# Patient Record
Sex: Male | Born: 1935 | Race: White | Hispanic: No | Marital: Married | Smoking: Former smoker
Health system: Southern US, Community
[De-identification: ages and names within clinical notes are randomized; demographics above are authoritative.]

## PROBLEM LIST (undated history)

## (undated) DIAGNOSIS — R32 Unspecified urinary incontinence: Secondary | ICD-10-CM

## (undated) DIAGNOSIS — T4145XA Adverse effect of unspecified anesthetic, initial encounter: Secondary | ICD-10-CM

## (undated) DIAGNOSIS — R112 Nausea with vomiting, unspecified: Secondary | ICD-10-CM

## (undated) DIAGNOSIS — R011 Cardiac murmur, unspecified: Secondary | ICD-10-CM

## (undated) DIAGNOSIS — Z9889 Other specified postprocedural states: Secondary | ICD-10-CM

## (undated) DIAGNOSIS — I10 Essential (primary) hypertension: Secondary | ICD-10-CM

## (undated) DIAGNOSIS — T8859XA Other complications of anesthesia, initial encounter: Secondary | ICD-10-CM

## (undated) DIAGNOSIS — K8020 Calculus of gallbladder without cholecystitis without obstruction: Secondary | ICD-10-CM

## (undated) DIAGNOSIS — R51 Headache: Secondary | ICD-10-CM

## (undated) DIAGNOSIS — K219 Gastro-esophageal reflux disease without esophagitis: Secondary | ICD-10-CM

## (undated) DIAGNOSIS — C801 Malignant (primary) neoplasm, unspecified: Secondary | ICD-10-CM

## (undated) HISTORY — PX: IMPLANT FLETCHER SUIT / INTRACAVITARY RADIATION APPLICATION: SUR685

## (undated) HISTORY — PX: TONSILLECTOMY: SUR1361

## (undated) HISTORY — PX: OTHER SURGICAL HISTORY: SHX169

## (undated) HISTORY — PX: HERNIA REPAIR: SHX51

---

## 1998-04-13 ENCOUNTER — Ambulatory Visit (HOSPITAL_COMMUNITY): Admission: RE | Admit: 1998-04-13 | Discharge: 1998-04-13 | Payer: Self-pay | Admitting: Urology

## 1999-07-06 ENCOUNTER — Encounter: Admission: RE | Admit: 1999-07-06 | Discharge: 1999-07-06 | Payer: Self-pay | Admitting: *Deleted

## 1999-07-06 ENCOUNTER — Encounter: Payer: Self-pay | Admitting: *Deleted

## 1999-07-13 ENCOUNTER — Encounter: Admission: RE | Admit: 1999-07-13 | Discharge: 1999-07-13 | Payer: Self-pay | Admitting: *Deleted

## 1999-07-13 ENCOUNTER — Encounter: Payer: Self-pay | Admitting: *Deleted

## 1999-08-23 ENCOUNTER — Other Ambulatory Visit: Admission: RE | Admit: 1999-08-23 | Discharge: 1999-08-23 | Payer: Self-pay | Admitting: Orthopedic Surgery

## 2002-09-17 HISTORY — PX: NEPHRECTOMY: SHX65

## 2003-02-19 ENCOUNTER — Encounter: Admission: RE | Admit: 2003-02-19 | Discharge: 2003-02-19 | Payer: Self-pay | Admitting: Internal Medicine

## 2003-02-19 ENCOUNTER — Encounter: Payer: Self-pay | Admitting: Internal Medicine

## 2003-03-08 ENCOUNTER — Encounter: Payer: Self-pay | Admitting: Urology

## 2003-03-10 ENCOUNTER — Encounter (INDEPENDENT_AMBULATORY_CARE_PROVIDER_SITE_OTHER): Payer: Self-pay | Admitting: *Deleted

## 2003-03-10 ENCOUNTER — Inpatient Hospital Stay (HOSPITAL_COMMUNITY): Admission: RE | Admit: 2003-03-10 | Discharge: 2003-03-14 | Payer: Self-pay | Admitting: Urology

## 2003-07-19 ENCOUNTER — Ambulatory Visit (HOSPITAL_COMMUNITY): Admission: RE | Admit: 2003-07-19 | Discharge: 2003-07-19 | Payer: Self-pay | Admitting: Urology

## 2004-08-01 ENCOUNTER — Ambulatory Visit (HOSPITAL_COMMUNITY): Admission: RE | Admit: 2004-08-01 | Discharge: 2004-08-01 | Payer: Self-pay | Admitting: Urology

## 2005-07-26 ENCOUNTER — Ambulatory Visit (HOSPITAL_COMMUNITY): Admission: RE | Admit: 2005-07-26 | Discharge: 2005-07-26 | Payer: Self-pay | Admitting: Urology

## 2005-09-17 HISTORY — PX: OTHER SURGICAL HISTORY: SHX169

## 2006-06-14 ENCOUNTER — Ambulatory Visit (HOSPITAL_COMMUNITY): Admission: RE | Admit: 2006-06-14 | Discharge: 2006-06-14 | Payer: Self-pay | Admitting: Urology

## 2006-07-10 ENCOUNTER — Encounter (INDEPENDENT_AMBULATORY_CARE_PROVIDER_SITE_OTHER): Payer: Self-pay | Admitting: Specialist

## 2006-07-10 ENCOUNTER — Inpatient Hospital Stay (HOSPITAL_COMMUNITY): Admission: RE | Admit: 2006-07-10 | Discharge: 2006-07-12 | Payer: Self-pay | Admitting: Urology

## 2006-10-15 ENCOUNTER — Ambulatory Visit: Admission: RE | Admit: 2006-10-15 | Discharge: 2006-12-24 | Payer: Self-pay | Admitting: Radiation Oncology

## 2007-05-06 ENCOUNTER — Ambulatory Visit (HOSPITAL_COMMUNITY): Admission: RE | Admit: 2007-05-06 | Discharge: 2007-05-06 | Payer: Self-pay | Admitting: Urology

## 2007-05-16 ENCOUNTER — Encounter (HOSPITAL_COMMUNITY): Admission: RE | Admit: 2007-05-16 | Discharge: 2007-06-11 | Payer: Self-pay | Admitting: Urology

## 2007-09-25 ENCOUNTER — Ambulatory Visit (HOSPITAL_COMMUNITY): Admission: RE | Admit: 2007-09-25 | Discharge: 2007-09-25 | Payer: Self-pay | Admitting: Urology

## 2010-03-03 ENCOUNTER — Ambulatory Visit (HOSPITAL_COMMUNITY): Admission: RE | Admit: 2010-03-03 | Discharge: 2010-03-03 | Payer: Self-pay | Admitting: Urology

## 2011-02-02 NOTE — Discharge Summary (Signed)
NAME:  [name], [name]                        ACCOUNT NO.:  [id number]   MEDICAL RECORD NO.:  [id number]                   PATIENT TYPE:  INP   LOCATION:  0373                                 FACILITY:  [facility]   PHYSICIAN:  Valetta Fuller, M.D.               DATE OF BIRTH:  24-Mar-1933   DATE OF ADMISSION:  03/10/2003  DATE OF DISCHARGE:  03/14/2003                                 DISCHARGE SUMMARY   DISCHARGE DIAGNOSES:  1. Malignant neoplasm of the kidney.  2. Acute renal failure.   PROCEDURE:  Hand-assisted laparoscopic right radical nephrectomy on March 10, 2003.   HISTORY OF PRESENT ILLNESS:  Mr. Nishi is a 77 year old male who is  primarily a patient of Dr. Darvin Neighbours'. The patient has been seen by Dr.  Earlene Plater primarily for a history of elevated PSA, BPH and some voiding  symptoms. The patient had recently developed intractable hiccups and as part  of his evaluation, had an abdominal ultrasound which showed a large right-  sided renal mass. A CT was performed which confirmed a 8-9 cm mass in the  lower pole of the right kidney, felt to be a renal cell carcinoma. We did  not feel that this was the cause for his hiccups but was an incidental  finding. Metastatic workup was negative and his contralateral kidney  appeared normal with the exception of an atypical cyst in the left kidney.  This was further pursued with MRI and felt to be a proteinaceous/hyperdense  cyst. This lesion will need to be followed.  Dr. Earlene Plater asked me to be  involved in his care for consideration of a hand-assisted laparoscopic  surgical removal of this.   HOSPITAL COURSE:  The patient was admitted on March 10, 2003, after  undergoing an uncomplicated hand-assisted laparoscopic radical nephrectomy.  The patient remained hemodynamically stable during the procedure and had  estimated blood loss of about 200 mL. His initial postoperative course was  complicated by some increased lethargy, thought to be  secondary to some  Thorazine for hiccups as well as his PCA morphine. He remained  hemodynamically stable.   On postoperative day #1, his hemoglobin was 12.4 but his creatinine had  increased to 2.4. His baseline creatinine was 1.2 preoperatively. Despite  his elevated creatinine, his urinary output remained excellent. His Foley  catheter was removed and the patient's situation was complicated by urinary  retention. The next morning, his creatinine had increased further to 2.7 and  it was unclear how much of this was obstructive in nature versus some acute  renal insufficiency. His medical physician was notified for help with  management of his hiccups as well as to inform him of this increasing  creatinine.   Consultation was obtained from Nephrology. They felt that this was probably  secondary to his obstructive uropathy from his acute urinary retention. By  postoperative day #3, the patient's lethargy  had improved and his mental  status was fairly normal. Urinary output continued to be excellent and his  creatinine decreased further to 2.3. By postoperative day #4, the patient  was tolerating a general diet well. He was up and ambulating and mental  status had completely normalized. His creatinine had also decreased to 1.9,  which is about what we expect with his nephrectomy.   DISPOSITION:  The patient was discharged home on March 14, 2003. He was given  some Flomax and was discharged home with his Foley catheter with  anticipation of a voiding trial once his renal function completely  stabilizes. He was given some Vicodin for pain. He will be seen in our  office in a couple of days for a voiding trial and repeat blood work.   PATHOLOGY:  His final pathology revealed a 9 cm papillary renal cell  carcinoma, which was limited to the kidney. Vascular margins were negative.  There was some mild to moderate arterial nephrosclerosis.                                               Valetta Fuller, M.D.    DSG/MEDQ  D:  03/25/2003  T:  03/25/2003  Job:  [id number]   cc:   Wilson Singer, M.D.  [address]  [place]  [place] [address]  Fax: [phone]   [name]. Ovidio Hanger, M.D.  [address]  [place]  [place] [address]  Fax: [phone]   Garnetta Buddy, M.D.  [address]  [place]  [place] [address]  Fax: [phone]

## 2011-02-02 NOTE — Consult Note (Signed)
NAME:  [name], [name]                        ACCOUNT NO.:  [id number]   MEDICAL RECORD NO.:  [id number]                   PATIENT TYPE:  INP   LOCATION:  0373                                 FACILITY:  [facility]   PHYSICIAN:  Garnetta Buddy, M.D.                DATE OF BIRTH:  July 21, 1933   DATE OF CONSULTATION:  DATE OF DISCHARGE:                                   CONSULTATION   HISTORY OF PRESENT ILLNESS:  A 77 year old white male status post  nephrectomy right side March 10, 2003.  Consultation obtained due to elevated  rising serum creatinine.  Previous creatinine March 10, 2003 - 1.2, March 11, 2003 - 2.4, March 12, 2003 - 2.7.  Fluid balance on March 10, 2003 - +2091,  March 11, 2003 - +1160, March 12, 2003 - -1720.   HISTORY OF PRESENTING ILLNESS:  This 77 year old white male with incidental  finding of right sided renal cell carcinoma.  He was admitted electively for  radical nephrectomy performed laparoscopically.  His intraoperative course  was punctated by a very brief episode of hypotension to a systolic of 80,  approximately 15 minutes into the case with a prompt rise in blood pressure  at 0900.  His urine output during the case remained low at 175 cc with 3,500  cc of intravenous fluid administered.  During the next 24 hours with a  positive fluid balance of 2091 it was found that he had a palpable bladder  and a coude catheter was placed at approximately 6:50 p.m. on March 11, 2003.  There was no previous history of renal disease.  No nonsteroidal  antiinflammatory use.  No use of ACE inhibitors.  No history of intravenous  contrast.  No use of illicit drugs.  There is a history of Crestor use.   PAST MEDICAL HISTORY:  1. Hypertension, mild.  2. Hyperlipidemia.  3. History of migraine headaches.  4. History of occasional JVD.  5. History of esophageal ulceration secondary to aspirin and caffeine use in     a migraine preparation.   MEDICATIONS:  1. Crestor 10 mg daily.  2. Tenoretic 25 mg q.h.s.   ALLERGIES:  NO KNOWN DRUG ALLERGIES.   REVIEW OF SYSTEMS:  GENERAL:  No fever, sweats or chills.  No malaise.  No  arthralgias or myalgias.  EYES:  No visual complaints.  Wears glasses.  EARS/NOSE/THROAT:  No hearing loss.  No epistaxis.  No sore throat.  No  mouth lesions.  CARDIOVASCULAR:  Denies anginal chest pain, orthopnea, PND,  palpitations, black out spells.  He states he has good exercise tolerance.  RESPIRATORY:  Denies cough, wheeze, hemoptysis.  He states he has been told  of no abnormality on chest x-ray.  ABDOMEN:  No nausea, no vomiting.  He states he has no bowel movement since  surgery.  He denies abdominal pain although, he does have some flank  pain  that is treated.  GENITOURINARY:  He admits to prostatism with nocturia 4-5 times.  He denies  any hematuria or proteinuria in the past.  He denies any history of ankle or  leg swelling; although, he does state he has had mild hypertension.  ENDOCRINE:  He is being treated for dyslipidemia.  He denies any history of  thyroid disease or adrenal disease.  Denies any history of obesity.  HEMATOLOGIC:  Denies any history of DVT, pulmonary embolus, no other history  of carcinoma.  No history of bleeding disorder.  NEUROLOGIC:  No history of stroke or seizures.  No numbness, pins and  needles or weakness in his upper or lower extremities.  No dysarthria.  No  diplopia.  He does have a history of migraine headaches for approximately 20  years which he has managed with Tenormin at low dose.   SOCIAL HISTORY:  1. He is married.  2. He lives with his wife.  3. He has three children Jonny Ruiz and Dewayne Hatch were in the room.  He also has a third     child who is in [facility].  4. He does not drink alcohol except on social occasions.  5. He does not smoke cigarettes but admitted to a previous history of very     minimal smoking approximately 20 years ago.   FAMILY HISTORY:  1. His mother died in her 18s  of old age.  2. His father died at 39 of cardiac disease.  3. There was no history of renal failure in the family.  4. No history of requiring dialysis.   PHYSICAL EXAMINATION:  GENERAL:  Revealed a pleasant gentleman who was in no  distress.  Appeared to be comfortable.  Maybe slightly lethargic.  VITAL SIGNS:  Blood pressure 110/70, pulse of 80, temperature 98.6.  HEENT:  Normocephalic, atraumatic.  Pupils round, equal, reactive.  Ears,  nose, throat normal appearance.  NECK:  Supple.  No thyromegaly, no adenopathy.  CARDIOVASCULAR:  Heart regular, rate and rhythm with no murmurs, rubs or  gallops.  No heave.  No thrills.  RESPIRATORY:  Lung fields are clear to auscultation bilaterally.  ABDOMEN:  Soft, nontender.  Although he had mild discomfort over the  nephrectomy site.  His bowel sounds are present but low pitched.  EXTREMITIES:  Reveals no cyanosis, clubbing or edema.  GENITALIA:  He was circumcised, has a Foley catheter.  MUSCULOSKELETAL:  No muscle wasting.  No joint swelling.   MOST RECENT LABS:  WBC 11.6, hemoglobin 12.4, platelets 177.  Calcium 8.4.  Sodium 138, potassium 2.3, chloride 106, CO2 28, BUN 14, creatinine 2.7,  glucose is 126.   ASSESSMENT/PLAN:  1. Acute renal failure, nonoliguric most likely secondary to an episode of     acute retention now in diuretic phase with anticipated recovery of renal     function.  I will check a urinalysis in order to make sure there is no     underlying renal disease or hematuria or proteinuria.  2. Hypokalemia.  We will encourage replacement.  We will change his IV     fluids over to D-5 1/2 normal saline at a total rate of 75 cc/hr with 20     mEq of KCL.  3. Crestor use.  I agree with discontinuation of Crestor.  There is some     data found in laboratory to suggest that Crestor has been associated with     rhabdomyolysis as well as causing some  direct damage to the kidney in    terms     of proteinuria and hematuria.  4.  Hypertension.  I believe that the antihypertensive effects of Tenormin     are being used in order to have prophylaxis against migraines.                                                 Garnetta Buddy, M.D.    MWW/MEDQ  D:  03/12/2003  T:  03/13/2003  Job:  [id number]

## 2011-02-02 NOTE — Discharge Summary (Signed)
[name], MINNIEFIELD                 ACCOUNT NO.:  [id number]   MEDICAL RECORD NO.:  [id number]          PATIENT TYPE:  INP   LOCATION:  1418                         FACILITY:  [facility]   PHYSICIAN:  Ronald L. Earlene Plater, M.D.  DATE OF BIRTH:  1933/03/30   DATE OF ADMISSION:  07/10/2006  DATE OF DISCHARGE:  07/12/2006                                 DISCHARGE SUMMARY   DISCHARGE DIAGNOSIS:  Prostate cancer.   OPERATIVE PROCEDURE:  1. Robotic radical prostatectomy with bilateral pelvic lymphadenectomy.  2. Repair of ventral hernia.   HISTORY AND PHYSICAL EXAMINATION:  Mr. Meech is a very nice 77 year old  white male who had chronically elevated PSA. He had a previous nephrectomy  for kidney cancer. He had a very large prostate and was found on biopsy to  have 60% of the biopsy Gleason score IX on the left side. A metastatic work  up was negative. After understanding risks, benefits and alternatives, he  elected to proceed. He was properly preoperatively evaluated and elected to  proceed accordingly.   PAST MEDICAL HISTORY/SOCIAL HISTORY AND FAMILY HISTORY/REVIEW OF SYSTEMS:  Please see history and physical for detail.   PHYSICAL EXAMINATION:  VITAL SIGNS: He was afebrile, vital signs stable.  GENERAL:  Well-nourished, well-developed, well groomed. Oriented x3.  HEENT: Normal.  NECK: Without mass or thyromegaly.  CHEST: Normal diaphragmatic motion.  ABDOMEN: Soft, nontender without mass, organomegaly or hernias.  EXTREMITIES: Normal.  NEURO: Intact.  SKIN: Normal.  GU: Penis, meatus, scrotum, testicles, adnexa, anus, perineum are normal.  RECTAL: Rectal vault is empty. Prostate is 25-30 grams, really benign to  palpation.   HOSPITAL COURSE:  The patient was admitted and after undergoing proper  preoperative evaluation, was taken to surgery on July 10, 2006 and  underwent robotic radical prostatectomy and bilateral pelvic lymphadectomy  and repair of ventral hernia uneventfully. He  did well that night. On  postoperative day one he was afebrile, he was resting well, extremities were  normal. He was on clear liquids and appeared to be healing well. Hemoglobin  was 13.2, hematocrit 37.8, white blood cell count was 14,100. BMET was  normal except for a creatinine of 1.9 which is his baseline. By July 12, 2006, postoperative day two he progressively did well. He had some hiccups  but they gradually resolved. He was ambulated. Diet was advanced. He was  discharged from the hospital at that time.   DISCHARGE MEDICATIONS:  Included Septra DS, Vicodin and Colace.   FOLLOW UP:  He is to see [place] next week to remove his staples and catheter.   DISCHARGE ACTIVITIES:  Improved.   FINAL PATHOLOGY:  Revealed a Gleason score IX which is 4+5 adenocarcinoma  involving the left lobe of the prostate with extracapsular extension. Focal  involvement external capsule by tumor. Benign [place] vesical's. Lymph  nodes had no tumor seen. He was a pathologic PT-3A TN0, PN-X.      Ronald L. Earlene Plater, M.D.  Electronically Signed     RLD/MEDQ  D:  07/30/2006  T:  07/31/2006  Job:  [id number]

## 2011-02-02 NOTE — Op Note (Signed)
NAME:  [name], [name]                        ACCOUNT NO.:  [id number]   MEDICAL RECORD NO.:  [id number]                   PATIENT TYPE:  INP   LOCATION:  0003                                 FACILITY:  [facility]   PHYSICIAN:  Valetta Fuller, M.D.               DATE OF BIRTH:  09/17/1933   DATE OF PROCEDURE:  03/10/2003  DATE OF DISCHARGE:                                 OPERATIVE REPORT   PREOPERATIVE DIAGNOSIS:  Right renal cell carcinoma.   POSTOPERATIVE DIAGNOSIS:  Right renal cell carcinoma.   PROCEDURE PERFORMED:  Hand-assisted right radical nephrectomy.   SURGEON:  Valetta Fuller, M.D.   ASSESSMENT:  [name]. [name], M.D.   ANESTHESIA:  General endotracheal.   INDICATIONS:  [name] recently was diagnosed with a large right renal  mass.  He actually developed some hiccups and an ultrasound of his abdomen  was performed.  This showed what appeared to be a solid mass of his right  kidney.  A CT with contrast confirmed an enhancing 8 cm lower pole mass of  the right kidney.  There was no evidence of adenopathy, renal vein or caval  thrombus, nor was there evidence of metastatic disease.  There was an  atypical cyst of the left kidney, which has been evaluated with ultrasound  and MRI and felt to be a proteinaceous cyst that will be watched carefully.  The patient is a patient of [name], M.D., who underwent extensive  counseling by Dr. Earlene Townsend.  It was recommended that the patient have a radical  nephrectomy, and consideration for a hand-assisted attempt was broached with  the patient.  Because he elected to proceed with that, we were asked to be  involved in his care and I met with the patient and his wife to discuss  these options as well.  The patient appeared to understand the advantages  and disadvantages of this approach, and a full informed consent was  obtained.  Preoperative renal function was normal in the contralateral  kidney and other than the  proteinaceous, appeared unremarkable.   TECHNIQUE AND FINDINGS:  The patient was brought to the operating room,  where he had successful induction of general endotracheal anesthesia.  A  Foley catheter was placed, as was an orogastric tube.  The patient was then  placed in approximately a 45 degree flank position. Great care was used in  padding the upper and lower extremities.  No significant flexion nor was the  kidney rest used.  The patient was prepped and draped in the usual manner.  A periumbilical 7 cm incision was made in the midline.  The fascia was  opened and the peritoneal cavity was entered.  A hand was able to get into  the peritoneal cavity.  There were no adhesions.  The lap disc was placed  and the abdomen was insufflated.  A 12 mm working port  was placed in  approximately the midclavicular line superiorly.  A camera was placed in the  axillary line just beneath the umbilicus.  A separate 5 mm port was placed  subcostally to allow for liver retraction.  All the ports were placed with  either hand guidance or direct visual inspection.  We then placed a hand  within the peritoneal cavity.  Palpation revealed a large mass in the lower  pole of the left kidney.  Careful inspection of the abdomen revealed no  other obvious pathology.  The colon was mobilized by incising the line of  Toldt from the hepatic flexure down toward the iliac vessels.  Once the  colon was mobilized, we freed up the lateral attachments of the kidney.  We  then carried that superiorly, freeing up the superior attachments to the  liver.  The retractor was then utilized to provide exposure by retracting  the liver and gallbladder superiorly.  Attention was then turned inferiorly.  The ureter was identified and clipped and by raising the kidney up, we were  able to flip it and identify the renal artery posteriorly.  This was then  dissected free for several centimeters.  The GIA stapling device was then   used to staple the artery.  We then turned our attention toward the top of  the kidney, where we dissected between the adrenal gland and Gerota's fascia  to free up the entire upper pole of the kidney.  The kidney was then  completely free other than the attachment to the renal vein.  Once had freed  all the surrounding lymphatic tissues, the renal vein was by itself and a  stapling device was used to cross that.  An extraction bag was used to place  the kidney within, and then the entire specimen was taken out through the  hand port incision.  The fascia was closed on the 12 mm trocars.  Each  trocar was taken out and carefully visually inspected to be sure there was  no significant bleeding.  The hand port incision was closed with a running  #1 PDS suture.  Skin was closed with clips.  The patient appeared to  tolerate the procedure well.  There were no obvious complications.  Sponge  and needle counts were correct.  Estimated blood loss was 250 mL.  The  patient was brought to the recovery room in stable condition.                                               Valetta Fuller, M.D.    DSG/MEDQ  D:  03/10/2003  T:  03/10/2003  Job:  [id number]

## 2011-02-02 NOTE — H&P (Signed)
NAME:  [name]                        ACCOUNT NO.:  [id number]   MEDICAL RECORD NO.:  [id number]                   PATIENT TYPE:  INP   LOCATION:  0003                                 FACILITY:  [facility]   PHYSICIAN:  Valetta Fuller, M.D.               DATE OF BIRTH:  Apr 11, 1933   DATE OF ADMISSION:  03/10/2003  DATE OF DISCHARGE:                                HISTORY & PHYSICAL   CHIEF COMPLAINT:  Right renal mass, for laparoscopic nephrectomy.   HISTORY OF PRESENT ILLNESS:  [name] is a 77 year old male.  He actually  developed some hiccups and, because of that, underwent an ultrasound, which  revealed a large mass in his right kidney.  The patient subsequently had a  CT scan in our office.  This revealed an 8-cm enhancing lesion in the lower  pole of the right kidney, which was clearly a renal cell carcinoma.  There  was also a questionable lesion in the left kidney.  It was, on ultrasound,  felt to be cystic, and patient was felt to have a proteinaceous or  hemorrhagic cyst of that left kidney.  A subsequent MRI was done, which  confirmed a large enhancing mass in the lower pole of the right kidney,  again, very suspicious for renal cell carcinoma.  On MRI, the lesion in the  left kidney appeared to be a complex proteinaceous or hemorrhagic cyst.  It  was not felt to be a solid lesion.  There is no evidence of metastatic  disease.  The patient has been otherwise asymptomatic and has had no gross  hematuria.  Additional evaluations completely negative.  The patient  underwent extensive counseling with Dr. Earlene Plater.  It was elected to do Hand  assisted radical nephrectomy.  They asked me to be involved in this case,  since I do that type of procedure.  I also spent time discussing things with  [name] and his wife.  We talked about some of the issues with regard to  success rates of laparoscopic procedures versus open nephrectomy.  The  patient presents now for his  procedure and will be admitted, hopefully, for  routine postoperative care.   PAST MEDICAL HISTORY:  Significant for hypertension.   MEDICATIONS:  He is on Tenoretic and also takes Crestor.   ALLERGIES:  He had no drug allergies.   PAST SURGICAL HISTORY:  He has had some orthopedic surgery.   SOCIAL HISTORY:  He has a previous minimal tobacco use history but quit in  the remote distant past.   FAMILY HISTORY:  Was otherwise noncontributory.   REVIEW OF SYSTEMS:  Noncontributory.   PHYSICAL EXAMINATION:  GENERAL:  He is on a thin male in no acute distress.  VITAL SIGNS:  He was afebrile.  His blood pressure was 100/60 with a pulse  of 64.  CHEST:  Was clear to auscultation.  NECK:  Showed no JVD.  ABDOMEN:  Was flat and soft without obvious palpable mass.  GENITOURINARY:  External genitalia was within normal limits.  EXTREMITIES:  Show no edema.   LABORATORY DATA:  Renal function was normal with a BUN and creatinine of 21  and 1.2 respectively.  His hemoglobin was 15.1.   ASSESSMENT:  Right renal mass, suspicious for renal cell carcinoma.  The  patient is to undergo a Hand assisted radical nephrectomy today and will be  admitted, hopefully, for routine postoperative care.                                               Valetta Fuller, M.D.    DSG/MEDQ  D:  03/10/2003  T:  03/10/2003  Job:  [id number]

## 2011-02-02 NOTE — Op Note (Signed)
[name], Jeffrey NO.:  [id number]   MEDICAL RECORD NO.:  [id number]          PATIENT TYPE:  INP   LOCATION:  1418                         FACILITY:  [facility]   PHYSICIAN:  Heloise Purpura, MD      DATE OF BIRTH:  [birth year]   DATE OF PROCEDURE:  07/10/2006  DATE OF DISCHARGE:                                 OPERATIVE REPORT   PREOPERATIVE DIAGNOSIS:  Clinically localized adenocarcinoma of the  prostate.   POSTOPERATIVE DIAGNOSES:  1. Clinically localized adenocarcinoma of prostate.  2. Ventral hernia.   PROCEDURES:  1. Exploratory laparoscopy with lysis of adhesions.  2. Robotic-assisted laparoscopic radical prostatectomy (right nerve-      sparing).  3. Bilateral laparoscopic pelvic lymphadenectomy.  4. Repair of ventral hernia.   SURGEON:  [name]. Earlene Plater, M.D.   FIRST ASSISTANT:  Crecencio Mc, M.D.   SECOND ASSISTANT:  Valetta Fuller, M.D.   ANESTHESIA:  General.   COMPLICATIONS:  None.   ESTIMATED BLOOD LOSS:  350 mL.   SPECIMEN:  1. Prostate and seminal vesicles.  2. Left pelvic lymph nodes.  3. Right pelvic lymph nodes.   DRAINS:  1. #19 Blake pelvic drain.  2. 20-French Coude catheter.   INDICATIONS:  [name] is a 77 year old gentleman who was found to have a  Gleason 9 clinically localized prostate cancer.  After undergoing a  metastatic evaluation and discussing treatment options, the patient elected  to proceed with surgical therapy.  Potential risks and benefits were  discussed with the patient and he consented.   DESCRIPTION OF PROCEDURE:  The patient was taken to the operating room and a  general anesthetic was administered.  He was given preoperative antibiotics,  placed in the dorsal lithotomy position, and prepped and draped in the usual  sterile fashion.  Next a preoperative time-out was performed.  A site was  then selected approximately 18 cm from the pubic symphysis and just to the  left of the umbilicus for another  camera port.  This was placed using a  standard open Hasson technique.  This allowed entry into the peritoneal  cavity.  A 12 mm port was placed.  Of note, prior to placement of port,  digital palpation was performed of the underlying peritoneum to ensure there  were no adhesions considering the patient's prior hand-assisted laparoscopic  radical nephrectomy.  There did not appear to be any adhesions.  A  pneumoperitoneum was then established and the 0 degrees lens was used to  inspect the abdomen.  On inspection of the abdomen, there were noted be some  adhesions very close to the camera port site.  However, the remaining ports  could be placed.  Bilateral 8 mm robotic ports were placed 16 cm from the  pubic symphysis and 10 cm lateral to the camera port.  An additional 5 mm  port was placed between the camera port and the right robotic port.  An  additional 8 mm robotic port was placed in the far left lateral abdominal  wall and a 12 mm port was placed on  the far right lateral abdominal wall for  laparoscopic assistance.  All ports were placed under direct vision and  without difficulty.  The camera was then brought through the right lateral  12 mm port site to better view the adhesions, which were seen just to the  right of the camera port.  There was noted to be bowel and, in particular,  small intestine that was adhesed up into what appeared to be a very small  ventral hernia just inferior to the camera port site.  Laparoscopic scissors  were used to carefully take down these adhesions, which allowed the bowel to  disengage from the abdominal wall.  There was noted to be a small ventral  hernia defect at this area from the patient's prior surgery.  At this point  the surgical cart was docked.  With the aid of the cautery scissors, the  bladder was reflected posteriorly allowing entry into the space of Retzius  and identification of the endopelvic fascia and prostate.  The endopelvic   fascia was incised from the apex back to the base of the prostate  bilaterally and the underlying levator muscle fibers were swept laterally  off the prostate.  During this dissection, there were noted be problems with  the vision system  of the [employer] robotic system.  While the procedure did  proceed with two-dimensional viewing, these problems were able to be  resolved with troubleshooting, which restored full visualization prior to  actual removal of the prostate.  The dorsal venous complex was stapled and  divided with a 45 mm flex ETS stapler.  The bladder neck was identified with  the aid of Foley catheter manipulation and the anterior bladder neck was  then incised.  The Foley catheter was identified and was noted to be off to  the right of midline consistent with a probable median lobe, which was not  unexpected considering the patient's large prostate size of approximately 80  cu cm.  Once the catheter was identified, the Foley catheter balloon was  deflated.  The catheter was brought into the operative field and used to  retract the prostate anteriorly.  This exposed the posterior bladder neck.  There was noted be a very large median lobe, which was able to be brought up  out of the bladder neck and retracted.  The median lobe was then excised and  dissection continued between the bladder neck and the median lobe.  Indigo  carmine was administered to help identify the left ureteral orifice.  The  right ureteral orifice did not efflux considering the patient had had a  prior right nephrectomy.  Dissection continued posteriorly until the vasa  deferentia and seminal vesicles were identified.  The vasa deferentia were  isolated and then divided and lifted anteriorly.  The seminal vesicles were  also dissected free with care to carefully control their arterial blood supply.  These structures were then also lifted anteriorly.  The space  between Denonvilliers' fascia and the anterior  rectum was then bluntly  developed, thereby isolating the vascular pedicles of the prostate.  On the  right side, the lateral prostatic fascia was incised allowing the  neurovascular bundle to be swept laterally and posteriorly off the prostate.  The vascular pedicle on this side was then ligated with Hem-o-lok clips and  sharply divided with cold scissor dissection.  The neurovascular bundle was  swept laterally off the prostate out to the apex of the prostate and off the  urethra.  On the left side, the vascular pedicle of the prostate was ligated  and a wide, non-nerve sparing procedure was performed.  The urethra was then  identified and sharply divided allowing the prostate specimen to be  disarticulated and placed up into the abdomen for later removal.  The pelvis  was then copiously irrigated and hemostasis was ensured.  With the  irrigation in the pelvis, air was injected into the rectal catheter and  there was no evidence of a rectal injury.  Attention then turned to the  right pelvic sidewall.  The fibrofatty tissue between the external iliac  vein, confluence of the iliac vessels, obturator nerve, and Cooper's  ligament was dissected free from the pelvic sidewall with Hem-o-lok clips  used for lymphostasis and hemostasis.  This was then passed off for  permanent pathologic analysis.  An identical procedure was then performed on  the contralateral side.  Attention then returned to the pelvis and the  bladder neck was identified.  The bladder neck was noted to be somewhat  large considering the patient's median lobe of the prostate was resected.  Therefore, figure-of-eight 2-0 Vicryl sutures were placed at the 5 and 7  o'clock positions.  This helped to also incorporate the left ureteral  orifice into the bladder and away from the urethral anastomosis.  A 2-0  Vicryl slip-knot was then placed at the 6 o'clock position between the  bladder neck and urethra to reapproximate these  structures.  A double-armed  3-0 Monocryl suture was then used to perform a 360 degree running  anastomosis between the bladder neck and urethra.  A new 20-French Coude  catheter was then inserted into the bladder.  This was irrigated and there  were no blood clots in the bladder and the anastomosis appeared to be  watertight.  A running #19 Blake drain was then brought through the left  robotic port and appropriately positioned in the pelvis.  It was secured to  the skin with a nylon suture.  The surgical cart was then undocked.  The  prostate specimen was then placed into the Endopouch retrieval bag via the  periumbilical port site.  A Vicryl stitch was placed through the abdominal  wall fascia of the right lateral 12 mm port site  and the port site was  closed with the aid of the suture passer device.  All remaining ports were then removed under direct vision.  The prostate specimen was then removed  intact within the Endopouch retrieval bag via the periumbilical site.  With  the prostate specimen removed, the previously mentioned ventral hernia  defect could be palpated just inferior this incision.  It was therefore  decided to extend the incision slightly inferiorly, therefore allowing the  fascial edges of the defect to be identified.  These fascial edges were then  incorporated into the closure of this wound with a running 0 Vicryl suture.  All port sites were injected with 0.25% Marcaine and reapproximated at the  skin level with staples.  Sterile dressings were applied.  The patient  appeared to have tolerated the procedure well without complications.  He was  able to be awakened and transferred to the recovery unit in satisfactory  condition.           ______________________________  Heloise Purpura, MD  Electronically Signed     LB/MEDQ  D:  07/11/2006  T:  07/12/2006  Job:  [id number]

## 2011-04-24 ENCOUNTER — Other Ambulatory Visit: Payer: Self-pay | Admitting: Dermatology

## 2011-10-05 DIAGNOSIS — L408 Other psoriasis: Secondary | ICD-10-CM | POA: Diagnosis not present

## 2011-10-05 DIAGNOSIS — B359 Dermatophytosis, unspecified: Secondary | ICD-10-CM | POA: Diagnosis not present

## 2011-11-16 DIAGNOSIS — L408 Other psoriasis: Secondary | ICD-10-CM | POA: Diagnosis not present

## 2012-01-14 DIAGNOSIS — C61 Malignant neoplasm of prostate: Secondary | ICD-10-CM | POA: Diagnosis not present

## 2012-01-15 DIAGNOSIS — I10 Essential (primary) hypertension: Secondary | ICD-10-CM | POA: Diagnosis not present

## 2012-01-15 DIAGNOSIS — D649 Anemia, unspecified: Secondary | ICD-10-CM | POA: Diagnosis not present

## 2012-01-15 DIAGNOSIS — I129 Hypertensive chronic kidney disease with stage 1 through stage 4 chronic kidney disease, or unspecified chronic kidney disease: Secondary | ICD-10-CM | POA: Diagnosis not present

## 2012-01-16 DIAGNOSIS — C61 Malignant neoplasm of prostate: Secondary | ICD-10-CM | POA: Diagnosis not present

## 2012-04-21 DIAGNOSIS — R972 Elevated prostate specific antigen [PSA]: Secondary | ICD-10-CM | POA: Diagnosis not present

## 2012-05-14 DIAGNOSIS — H2510 Age-related nuclear cataract, unspecified eye: Secondary | ICD-10-CM | POA: Diagnosis not present

## 2012-05-14 DIAGNOSIS — H3530 Unspecified macular degeneration: Secondary | ICD-10-CM | POA: Diagnosis not present

## 2012-05-14 DIAGNOSIS — H25049 Posterior subcapsular polar age-related cataract, unspecified eye: Secondary | ICD-10-CM | POA: Diagnosis not present

## 2012-05-20 DIAGNOSIS — L408 Other psoriasis: Secondary | ICD-10-CM | POA: Diagnosis not present

## 2012-06-18 DIAGNOSIS — H3530 Unspecified macular degeneration: Secondary | ICD-10-CM | POA: Diagnosis not present

## 2012-06-18 DIAGNOSIS — H2510 Age-related nuclear cataract, unspecified eye: Secondary | ICD-10-CM | POA: Diagnosis not present

## 2012-07-15 DIAGNOSIS — Z23 Encounter for immunization: Secondary | ICD-10-CM | POA: Diagnosis not present

## 2012-07-21 DIAGNOSIS — E291 Testicular hypofunction: Secondary | ICD-10-CM | POA: Diagnosis not present

## 2012-07-21 DIAGNOSIS — N529 Male erectile dysfunction, unspecified: Secondary | ICD-10-CM | POA: Diagnosis not present

## 2012-07-21 DIAGNOSIS — C61 Malignant neoplasm of prostate: Secondary | ICD-10-CM | POA: Diagnosis not present

## 2012-07-21 DIAGNOSIS — N3942 Incontinence without sensory awareness: Secondary | ICD-10-CM | POA: Diagnosis not present

## 2012-07-21 DIAGNOSIS — C649 Malignant neoplasm of unspecified kidney, except renal pelvis: Secondary | ICD-10-CM | POA: Diagnosis not present

## 2012-07-28 DIAGNOSIS — E785 Hyperlipidemia, unspecified: Secondary | ICD-10-CM | POA: Diagnosis not present

## 2012-07-28 DIAGNOSIS — M949 Disorder of cartilage, unspecified: Secondary | ICD-10-CM | POA: Diagnosis not present

## 2012-07-28 DIAGNOSIS — Z125 Encounter for screening for malignant neoplasm of prostate: Secondary | ICD-10-CM | POA: Diagnosis not present

## 2012-07-28 DIAGNOSIS — I10 Essential (primary) hypertension: Secondary | ICD-10-CM | POA: Diagnosis not present

## 2012-08-04 DIAGNOSIS — I10 Essential (primary) hypertension: Secondary | ICD-10-CM | POA: Diagnosis not present

## 2012-08-04 DIAGNOSIS — Z1331 Encounter for screening for depression: Secondary | ICD-10-CM | POA: Diagnosis not present

## 2012-08-04 DIAGNOSIS — Z0000 Encounter for general adult medical examination without abnormal findings: Secondary | ICD-10-CM | POA: Diagnosis not present

## 2012-08-04 DIAGNOSIS — E785 Hyperlipidemia, unspecified: Secondary | ICD-10-CM | POA: Diagnosis not present

## 2012-08-08 DIAGNOSIS — Z1212 Encounter for screening for malignant neoplasm of rectum: Secondary | ICD-10-CM | POA: Diagnosis not present

## 2012-09-16 ENCOUNTER — Other Ambulatory Visit: Payer: Self-pay | Admitting: Gastroenterology

## 2012-09-18 ENCOUNTER — Encounter (HOSPITAL_COMMUNITY): Payer: Self-pay | Admitting: *Deleted

## 2012-09-18 NOTE — Pre-Procedure Instructions (Signed)
Your procedure is scheduled XB:[id number], September 30, 2012 Report to [facility] Admitting at:1300 Call this number if you have problems morning of your procedure:[phone]  Follow all bowel prep instructions per your doctor's orders.  Do not eat or drink anything after midnight the night before your procedure. You may brush your teeth, rinse out your mouth, but no water, no food, no chewing gum, no mints, no candies, no chewing tobacco.     Take these medicines the morning of your procedure with A SIP OF WATER:Tenerotic   Please make arrangements for a responsible person to drive you home after the procedure. You cannot go home by cab/taxi. We recommend you have someone with you at home the first 24 hours after your procedure. Driver for procedure is wife Britta Mccreedy  LEAVE ALL VALUABLES, JEWELRY, BILLFOLD AT HOME.  NO DENTURES, CONTACT LENSES ALLOWED IN THE ENDOSCOPY ROOM.   YOU MAY WEAR DEODORANT, PLEASE REMOVE ALL JEWELRY, WATCHES RINGS, BODY PIERCINGS AND LEAVE AT HOME.   WOMEN: NO MAKE-UP, LOTIONS PERFUMES

## 2012-09-19 ENCOUNTER — Encounter (HOSPITAL_COMMUNITY): Payer: Self-pay | Admitting: Pharmacy Technician

## 2012-09-30 ENCOUNTER — Encounter (HOSPITAL_COMMUNITY): Payer: Self-pay | Admitting: Anesthesiology

## 2012-09-30 ENCOUNTER — Encounter (HOSPITAL_COMMUNITY): Payer: Self-pay | Admitting: *Deleted

## 2012-09-30 ENCOUNTER — Encounter (HOSPITAL_COMMUNITY)
Admission: RE | Disposition: A | Discharge status: Home / Self Care | Payer: Self-pay | Source: Ambulatory Visit | Attending: Gastroenterology

## 2012-09-30 ENCOUNTER — Ambulatory Visit (HOSPITAL_COMMUNITY): Payer: Medicare Other | Admitting: Anesthesiology

## 2012-09-30 ENCOUNTER — Ambulatory Visit (HOSPITAL_COMMUNITY)
Admission: RE | Admit: 2012-09-30 | Discharge: 2012-09-30 | Disposition: A | Discharge status: Home / Self Care | Payer: Medicare Other | Source: Ambulatory Visit | Attending: Gastroenterology | Admitting: Gastroenterology

## 2012-09-30 DIAGNOSIS — Z85528 Personal history of other malignant neoplasm of kidney: Secondary | ICD-10-CM | POA: Insufficient documentation

## 2012-09-30 DIAGNOSIS — Z8546 Personal history of malignant neoplasm of prostate: Secondary | ICD-10-CM | POA: Diagnosis not present

## 2012-09-30 DIAGNOSIS — I129 Hypertensive chronic kidney disease with stage 1 through stage 4 chronic kidney disease, or unspecified chronic kidney disease: Secondary | ICD-10-CM | POA: Insufficient documentation

## 2012-09-30 DIAGNOSIS — E7800 Pure hypercholesterolemia, unspecified: Secondary | ICD-10-CM | POA: Insufficient documentation

## 2012-09-30 DIAGNOSIS — K648 Other hemorrhoids: Secondary | ICD-10-CM | POA: Insufficient documentation

## 2012-09-30 DIAGNOSIS — Z7982 Long term (current) use of aspirin: Secondary | ICD-10-CM | POA: Diagnosis not present

## 2012-09-30 DIAGNOSIS — D126 Benign neoplasm of colon, unspecified: Secondary | ICD-10-CM | POA: Insufficient documentation

## 2012-09-30 DIAGNOSIS — R195 Other fecal abnormalities: Secondary | ICD-10-CM | POA: Diagnosis not present

## 2012-09-30 DIAGNOSIS — N183 Chronic kidney disease, stage 3 unspecified: Secondary | ICD-10-CM | POA: Insufficient documentation

## 2012-09-30 DIAGNOSIS — Z9079 Acquired absence of other genital organ(s): Secondary | ICD-10-CM | POA: Insufficient documentation

## 2012-09-30 DIAGNOSIS — Z86718 Personal history of other venous thrombosis and embolism: Secondary | ICD-10-CM | POA: Diagnosis not present

## 2012-09-30 HISTORY — DX: Malignant (primary) neoplasm, unspecified: C80.1

## 2012-09-30 HISTORY — DX: Headache: R51

## 2012-09-30 HISTORY — PX: COLONOSCOPY WITH PROPOFOL: SHX5780

## 2012-09-30 HISTORY — DX: Unspecified urinary incontinence: R32

## 2012-09-30 HISTORY — DX: Essential (primary) hypertension: I10

## 2012-09-30 SURGERY — COLONOSCOPY WITH PROPOFOL
Anesthesia: Monitor Anesthesia Care

## 2012-09-30 MED ORDER — EPINEPHRINE HCL 0.1 MG/ML IJ SOLN
INTRAMUSCULAR | Status: AC
  Filled 2012-09-30: qty 10

## 2012-09-30 MED ORDER — SPOT INK MARKER SYRINGE KIT
PACK | SUBMUCOSAL | Status: AC
  Filled 2012-09-30: qty 5

## 2012-09-30 MED ORDER — MIDAZOLAM HCL 10 MG/2ML IJ SOLN
INTRAMUSCULAR | Status: DC | PRN
  Administered 2012-09-30 (×2): 2.5 mg via INTRAVENOUS

## 2012-09-30 MED ORDER — SODIUM CHLORIDE 0.9 % IV SOLN: INTRAVENOUS | Status: DC

## 2012-09-30 MED ORDER — SODIUM CHLORIDE 0.9 % IJ SOLN
INTRAMUSCULAR | Status: DC | PRN
  Administered 2012-09-30: 17:00:00

## 2012-09-30 MED ORDER — FENTANYL CITRATE 0.05 MG/ML IJ SOLN
INTRAMUSCULAR | Status: AC
  Filled 2012-09-30: qty 4

## 2012-09-30 MED ORDER — FENTANYL CITRATE 0.05 MG/ML IJ SOLN
INTRAMUSCULAR | Status: DC | PRN
  Administered 2012-09-30: 50 ug via INTRAVENOUS

## 2012-09-30 MED ORDER — MIDAZOLAM HCL 10 MG/2ML IJ SOLN
INTRAMUSCULAR | Status: AC
  Filled 2012-09-30: qty 4

## 2012-09-30 MED ORDER — SPOT INK MARKER SYRINGE KIT
PACK | SUBMUCOSAL | Status: DC | PRN
  Administered 2012-09-30: 4 mL via SUBMUCOSAL

## 2012-09-30 MED ORDER — LACTATED RINGERS IV SOLN: INTRAVENOUS | Status: DC

## 2012-09-30 MED ORDER — FENTANYL CITRATE 0.05 MG/ML IJ SOLN: 25.0000 ug | INTRAMUSCULAR | Status: DC | PRN

## 2012-09-30 SURGICAL SUPPLY — 22 items

## 2012-09-30 NOTE — Op Note (Signed)
Procedure: Colonoscopy with polypectomy  Indication: Hemoccult-positive stool with normal hemoglobin level.  Endoscopist: Danise Edge  Premedication: Fentanyl 50 mcg Versed 5 mg  Procedure: The patient was placed in the left lateral decubitus position. Anal inspection and digital rectal exam were normal. The Pentax pediatric colonoscope was introduced into the rectum and advanced to the cecum. A normal-appearing ileocecal valve and appendiceal orifice were identified. Colonic preparation for the exam today was good.  Rectum. Normal. Retroflex view of the distal rectum reveals large nonbleeding internal hemorrhoids.  Sigmoid colon. In the distal sigmoid colon at approximately 20 cm from the anal verge a 3 cm pedunculated polyp was identified. Polyp reduction was performed by injecting 2 cc of epinephrine in the stalk and 4 cc of epinephrine in the polyp head. The polyp was removed with the electrocautery snare and submitted for pathological interpretation. The polypectomy site was tattooed with spot.  Ascending colon. Normal.  Splenic flexure. Normal.  Transverse colon. Normal.  Hepatic flexure. Normal.  Descending colon. Normal.  Cecum and ileocecal valve. Normal.  Assessment:  #1. From the distal sigmoid colon a 3 cm pedunculated polyp was removed with the electrocautery snare and submitted for pathologic interpretation.  #2. Large nonbleeding internal hemorrhoids.

## 2012-09-30 NOTE — Anesthesia Preprocedure Evaluation (Deleted)
Anesthesia Evaluation  Patient identified by MRN, date of birth, ID band Patient awake    Reviewed: Allergy & Precautions, H&P , NPO status , Patient's Chart, lab work & pertinent test results, reviewed documented beta blocker date and time   Airway Mallampati: II TM Distance: >3 FB Neck ROM: full    Dental No notable dental hx. (+) Teeth Intact and Dental Advisory Given   Pulmonary neg pulmonary ROS,  breath sounds clear to auscultation  Pulmonary exam normal       Cardiovascular Exercise Tolerance: Good hypertension, Pt. on home beta blockers Rhythm:regular Rate:Normal     Neuro/Psych negative neurological ROS  negative psych ROS   GI/Hepatic negative GI ROS, Neg liver ROS,   Endo/Other  negative endocrine ROS  Renal/GU negative Renal ROS  negative genitourinary   Musculoskeletal   Abdominal   Peds  Hematology negative hematology ROS (+)   Anesthesia Other Findings   Reproductive/Obstetrics negative OB ROS                          Anesthesia Physical Anesthesia Plan  ASA: II  Anesthesia Plan: MAC   Post-op Pain Management:    Induction:   Airway Management Planned: Simple Face Mask  Additional Equipment:   Intra-op Plan:   Post-operative Plan:   Informed Consent: I have reviewed the patients History and Physical, chart, labs and discussed the procedure including the risks, benefits and alternatives for the proposed anesthesia with the patient or authorized representative who has indicated his/her understanding and acceptance.   Dental Advisory Given  Plan Discussed with: CRNA and Surgeon  Anesthesia Plan Comments:         Anesthesia Quick Evaluation

## 2012-09-30 NOTE — H&P (Signed)
  Problem: Hemoccult-positive stool on aspirin associated with a normal hemoglobin level.  History: The patient is a 79 year old male who has never undergone screening colonoscopy in the past. He has undergone a screening flexible proctosigmoidoscopy years ago.  The patient submitted stool for Hemoccult testing following his health maintenance physical exam. His stool returned heme positive. His hemoglobin was normal.  The patient is scheduled to undergo a diagnostic colonoscopy.  Allergies: ACE inhibitors  Chronic medications: Tenoretic. Cozaar. Lipitor. Caltrate. Aspirin. Lupron.  Past medical and surgical history: Tonsillectomy. Right nephrectomy. Multiple hernia surgeries. Prostatectomy. Hypercholesterolemia. Leg deep venous thrombosis. Psoriasis. Esophageal rupture in 1986. Prostate cancer surgery followed by radiation. Osteopenia. Hypertension. Renal cell cancer of the right kidney. Stage III chronic kidney disease with an estimated glomerular  filtration rate 36.8 mL per minute.  Habits: The patient has never smoked cigarettes and does not consume alcohol.  Exam: The patient is alert and lying comfortably on the endoscopy stretcher. Lungs are clear to auscultation. Cardiac exam reveals a regular rhythm. Abdomen is soft, flat, and nontender to palpation in all quadrants.  Plan: Proceed with diagnostic colonoscopy to evaluate Hemoccult-positive stool associated with a normal hemoglobin level.

## 2012-10-01 ENCOUNTER — Encounter (HOSPITAL_COMMUNITY): Payer: Self-pay | Admitting: Gastroenterology

## 2012-10-01 DIAGNOSIS — D126 Benign neoplasm of colon, unspecified: Secondary | ICD-10-CM | POA: Diagnosis not present

## 2012-10-27 DIAGNOSIS — R972 Elevated prostate specific antigen [PSA]: Secondary | ICD-10-CM | POA: Diagnosis not present

## 2012-10-29 DIAGNOSIS — C61 Malignant neoplasm of prostate: Secondary | ICD-10-CM | POA: Diagnosis not present

## 2012-11-18 DIAGNOSIS — L219 Seborrheic dermatitis, unspecified: Secondary | ICD-10-CM | POA: Diagnosis not present

## 2012-11-18 DIAGNOSIS — D1801 Hemangioma of skin and subcutaneous tissue: Secondary | ICD-10-CM | POA: Diagnosis not present

## 2012-11-18 DIAGNOSIS — L408 Other psoriasis: Secondary | ICD-10-CM | POA: Diagnosis not present

## 2012-11-18 DIAGNOSIS — D2320 Other benign neoplasm of skin of unspecified ear and external auricular canal: Secondary | ICD-10-CM | POA: Diagnosis not present

## 2012-12-22 DIAGNOSIS — I129 Hypertensive chronic kidney disease with stage 1 through stage 4 chronic kidney disease, or unspecified chronic kidney disease: Secondary | ICD-10-CM | POA: Diagnosis not present

## 2012-12-22 DIAGNOSIS — D649 Anemia, unspecified: Secondary | ICD-10-CM | POA: Diagnosis not present

## 2012-12-22 DIAGNOSIS — N2581 Secondary hyperparathyroidism of renal origin: Secondary | ICD-10-CM | POA: Diagnosis not present

## 2013-01-06 ENCOUNTER — Telehealth: Payer: Self-pay | Admitting: Oncology

## 2013-01-06 NOTE — Telephone Encounter (Signed)
S/W PT IN RE TO NP APPT 05/16 @ 1:30 W/DR. SHADAD REFERRING DR. MARTIN WEB DX- DEC'D PLTS CT WELCOME PACKET MAILED.

## 2013-01-08 ENCOUNTER — Telehealth: Payer: Self-pay | Admitting: Oncology

## 2013-01-08 NOTE — Telephone Encounter (Signed)
C/D 01/08/13 for appt. 01/30/13

## 2013-01-15 DIAGNOSIS — D649 Anemia, unspecified: Secondary | ICD-10-CM | POA: Diagnosis not present

## 2013-01-19 DIAGNOSIS — N529 Male erectile dysfunction, unspecified: Secondary | ICD-10-CM | POA: Diagnosis not present

## 2013-01-19 DIAGNOSIS — Z85528 Personal history of other malignant neoplasm of kidney: Secondary | ICD-10-CM | POA: Diagnosis not present

## 2013-01-19 DIAGNOSIS — E291 Testicular hypofunction: Secondary | ICD-10-CM | POA: Diagnosis not present

## 2013-01-19 DIAGNOSIS — C61 Malignant neoplasm of prostate: Secondary | ICD-10-CM | POA: Diagnosis not present

## 2013-01-26 ENCOUNTER — Other Ambulatory Visit: Payer: Self-pay | Admitting: Oncology

## 2013-01-26 DIAGNOSIS — D649 Anemia, unspecified: Secondary | ICD-10-CM

## 2013-01-30 ENCOUNTER — Encounter: Payer: Self-pay | Admitting: Oncology

## 2013-01-30 ENCOUNTER — Ambulatory Visit: Payer: Medicare Other

## 2013-01-30 ENCOUNTER — Ambulatory Visit (HOSPITAL_BASED_OUTPATIENT_CLINIC_OR_DEPARTMENT_OTHER): Payer: Medicare Other | Admitting: Oncology

## 2013-01-30 ENCOUNTER — Other Ambulatory Visit (HOSPITAL_BASED_OUTPATIENT_CLINIC_OR_DEPARTMENT_OTHER): Payer: Medicare Other | Admitting: Lab

## 2013-01-30 VITALS — BP 136/75 | HR 71 | Temp 97.9°F | Resp 18 | Ht 71.0 in | Wt 147.6 lb

## 2013-01-30 DIAGNOSIS — D649 Anemia, unspecified: Secondary | ICD-10-CM | POA: Diagnosis not present

## 2013-01-30 DIAGNOSIS — D696 Thrombocytopenia, unspecified: Secondary | ICD-10-CM

## 2013-01-30 DIAGNOSIS — Z8546 Personal history of malignant neoplasm of prostate: Secondary | ICD-10-CM

## 2013-01-30 LAB — COMPREHENSIVE METABOLIC PANEL (CC13)
ALT: 29 U/L (ref 0–55)
AST: 36 U/L — ABNORMAL HIGH (ref 5–34)
Albumin: 3.5 g/dL (ref 3.5–5.0)
Alkaline Phosphatase: 68 U/L (ref 40–150)
Potassium: 3.7 mEq/L (ref 3.5–5.1)
Sodium: 142 mEq/L (ref 136–145)
Total Bilirubin: 1.35 mg/dL — ABNORMAL HIGH (ref 0.20–1.20)
Total Protein: 7 g/dL (ref 6.4–8.3)

## 2013-01-30 LAB — CBC WITH DIFFERENTIAL/PLATELET
BASO%: 0.2 % (ref 0.0–2.0)
Eosinophils Absolute: 0 10*3/uL (ref 0.0–0.5)
LYMPH%: 12.7 % — ABNORMAL LOW (ref 14.0–49.0)
MCHC: 34.3 g/dL (ref 32.0–36.0)
MCV: 85.2 fL (ref 79.3–98.0)
MONO%: 10.6 % (ref 0.0–14.0)
NEUT#: 5.9 10*3/uL (ref 1.5–6.5)
Platelets: 153 10*3/uL (ref 140–400)
RBC: 5.05 10*6/uL (ref 4.20–5.82)
RDW: 14.5 % (ref 11.0–14.6)
WBC: 7.7 10*3/uL (ref 4.0–10.3)

## 2013-01-30 NOTE — Progress Notes (Signed)
Reason for Referral: Thrombocytopenia.  HPI: [name] is a 79 year old gentleman currently of [place] was referred to me for the evaluation of thrombocytopenia. He is a pleasant gentleman with a past medical history significant for prostate cancer dating back to 2007 and currently under the care of Dr. Gaynelle Arabian. He has also history of kidney tumor that was removed in 2004. His prostate cancer under reasonable control after a prostatectomy and radiation and currently receives Lupron intermittently. He also developed renal insufficiency and followed by Dr. Hyman Hopes and his creatinine have ranged around 1.7 or so. On his last CBC he was noted to have a platelet count of 140 with a normal white cell count and normal hemoglobin. A repeat CBC done on 12/22/2012 and should this blood test was 150 with the lower limit of normal of 155. Clinically he is asymptomatic from this. He did not report any symptoms of bleeding such as: Epistaxis, hematemesis, hemoptysis, melena or hematochezia.  Overall he feels fairly well other than mild fatigue she is completely asymptomatic. He does not report any back pain or shoulder pain does not report any genitourinary complaints and continue to drive to 10 and that test activity of daily living without any hindrance or decline.  Past Medical History  Diagnosis Date  . Hypertension   . Headache     migraines  . Cancer     ,prostate,kidney right  . Incontinence of urine   :  Past Surgical History  Procedure Laterality Date  . Tonsillectomy      age 79  . Nephrectomy  2004    Right  . Postatectomy  2007  . Hernia repair      Left and Right   . Implant fletcher suit / intracavitary radiation application    . Left leg vein procedure blood clot removed      . Colonoscopy with propofol  09/30/2012    Procedure: COLONOSCOPY WITH PROPOFOL;  Surgeon: Charolett Bumpers, MD;  Location: [facility] ENDOSCOPY;  Service: Endoscopy;  Laterality: N/A;   Current Outpatient  Prescriptions  Medication Sig Dispense Refill  . aspirin 81 MG tablet Take 81 mg by mouth daily.      Marland Kitchen atenolol-chlorthalidone (TENORETIC) 50-25 MG per tablet Take 0.5 tablets by mouth every morning.      Marland Kitchen atorvastatin (LIPITOR) 40 MG tablet Take 40 mg by mouth at bedtime.      . calcipotriene (DOVONOX) 0.005 % cream Apply 1 application topically See admin instructions. He applies at bedtime as needed to scalp for psoriasis alternating with Ultravate Cream every 2 weeks.      . Calcium Carbonate-Vitamin D (CALTRATE 600+D PO) Take 1 tablet by mouth daily after supper.      . Flurandrenolide 4 MCG/SQCM TAPE Apply 1 each topically 2 (two) times a week.      . halobetasol (ULTRAVATE) 0.05 % cream Apply 1 application topically See admin instructions. He applies at bedtime as needed to scalp for psoriasis alternating with Dovonox Cream every 2 weeks.      Marland Kitchen leuprolide (LUPRON) 7.5 MG injection Inject 7.5 mg into the muscle as needed. He takes for his increased prostate specific antigen (PSA) levels.      Marland Kitchen losartan (COZAAR) 50 MG tablet Take 50 mg by mouth every morning.       No current facility-administered medications for this visit.        Allergies  Allergen Reactions  . Ace Inhibitors Cough  . Garlic Other (See Comments)  Headaches  :  No family history on file.:  History   Social History  . Marital Status: Married    Spouse Name: N/A    Number of Children: N/A  . Years of Education: N/A   Occupational History  . Not on file.   Social History Main Topics  . Smoking status: Former Games developer  . Smokeless tobacco: Never Used  . Alcohol Use: Yes  . Drug Use: No  . Sexually Active:    Other Topics Concern  . Not on file   Social History Narrative  . No narrative on file  :  A comprehensive review of systems was negative.  Exam: ECOG 1 Blood pressure 136/75, pulse 71, temperature 97.9 F (36.6 C), temperature source Oral, resp. rate 18, height 5\' 11"  (1.803 m),  weight 147 lb 9.6 oz (66.951 kg). General appearance: alert and appears stated age Head: Normocephalic, without obvious abnormality, atraumatic Eyes: conjunctivae/corneas clear. PERRL, EOM's intact. Fundi benign. Nose: Nares normal. Septum midline. Mucosa normal. No drainage or sinus tenderness. Throat: lips, mucosa, and tongue normal; teeth and gums normal Neck: no adenopathy, no carotid bruit, no JVD, supple, symmetrical, trachea midline and thyroid not enlarged, symmetric, no tenderness/mass/nodules Resp: clear to auscultation bilaterally Chest wall: no tenderness Cardio: regular rate and rhythm, S1, S2 normal, no murmur, click, rub or gallop GI: soft, non-tender; bowel sounds normal; no masses,  no organomegaly Extremities: extremities normal, atraumatic, no cyanosis or edema Skin: Skin color, texture, turgor normal. No rashes or lesions Lymph nodes: Cervical, supraclavicular, and axillary nodes normal.   Recent Labs  01/30/13 1332  WBC 7.7  HGB 14.7  HCT 43.0  PLT 153    Recent Labs  01/30/13 1332  NA 142  K 3.7  CL 103  CO2 28  GLUCOSE 131*  BUN 28.4*  CREATININE 2.2*  CALCIUM 9.4     Blood smear review: Normal.    Assessment and Plan:   79 year old gentleman with the following issues:  1. Thrombocytopenia: His thrombocytopenia is relatively mild and actually have resolved today with his platelet counts begin 153 with the lower limit of normal at 150. Differential diagnosis was discussed today with [name] which include in his condition includes myelosuppression from radiation therapy, early signs of myelodysplastic syndrome less likely, immune thrombocytopenia his ITP and possibly medications. The fact that his platelet count have recovered spontaneously and the fact that his white cell count, hemoglobin and peripheral smear are all normal goes against this being a sign of a hematological disorder such as leukemia or lymphoma. Condition such as TTP, HUS, DIC  are extremely unlikely in this scenario. I do not suggest any further hematological workup I do suggest however continuous hematological checkups with an annual CBC which she prefers to have done by his primary care physician. I will be happy to see him in the future if his blood counts drop again.  2. Prostate cancer: Is diagnosis dates back to 2007 per his report he is a Gleason score of 9. He status post prostatectomy followed by radiation therapy and currently has a rise in his PSA. He is being treated with androgen depravation with her intermittent Lupron. I discussed prostate cancer with him briefly and I offered my services in the future should he needs it especially if he develops advanced disease and possibly castration resistant disease.   At this time [name] will like to continue to follow with Dr. Earlene Plater but certainly in the future he is interested in establishing  care here at the [facility] and at that time will be happy to see him.  All his questions were answered today.

## 2013-01-30 NOTE — Progress Notes (Signed)
Checked in new patient. No financial issues. °

## 2013-02-23 DIAGNOSIS — R972 Elevated prostate specific antigen [PSA]: Secondary | ICD-10-CM | POA: Diagnosis not present

## 2013-03-02 DIAGNOSIS — C61 Malignant neoplasm of prostate: Secondary | ICD-10-CM | POA: Diagnosis not present

## 2013-04-20 DIAGNOSIS — R972 Elevated prostate specific antigen [PSA]: Secondary | ICD-10-CM | POA: Diagnosis not present

## 2013-05-11 DIAGNOSIS — N3941 Urge incontinence: Secondary | ICD-10-CM | POA: Diagnosis not present

## 2013-05-11 DIAGNOSIS — R972 Elevated prostate specific antigen [PSA]: Secondary | ICD-10-CM | POA: Diagnosis not present

## 2013-05-11 DIAGNOSIS — C61 Malignant neoplasm of prostate: Secondary | ICD-10-CM | POA: Diagnosis not present

## 2013-05-25 DIAGNOSIS — L408 Other psoriasis: Secondary | ICD-10-CM | POA: Diagnosis not present

## 2013-05-25 DIAGNOSIS — D239 Other benign neoplasm of skin, unspecified: Secondary | ICD-10-CM | POA: Diagnosis not present

## 2013-05-25 DIAGNOSIS — D2320 Other benign neoplasm of skin of unspecified ear and external auricular canal: Secondary | ICD-10-CM | POA: Diagnosis not present

## 2013-05-28 DIAGNOSIS — C61 Malignant neoplasm of prostate: Secondary | ICD-10-CM | POA: Diagnosis not present

## 2013-05-28 DIAGNOSIS — C649 Malignant neoplasm of unspecified kidney, except renal pelvis: Secondary | ICD-10-CM | POA: Diagnosis not present

## 2013-06-03 DIAGNOSIS — C649 Malignant neoplasm of unspecified kidney, except renal pelvis: Secondary | ICD-10-CM | POA: Diagnosis not present

## 2013-06-03 DIAGNOSIS — N289 Disorder of kidney and ureter, unspecified: Secondary | ICD-10-CM | POA: Diagnosis not present

## 2013-06-03 DIAGNOSIS — C61 Malignant neoplasm of prostate: Secondary | ICD-10-CM | POA: Diagnosis not present

## 2013-06-03 DIAGNOSIS — E0789 Other specified disorders of thyroid: Secondary | ICD-10-CM | POA: Diagnosis not present

## 2013-06-03 DIAGNOSIS — R911 Solitary pulmonary nodule: Secondary | ICD-10-CM | POA: Diagnosis not present

## 2013-06-03 DIAGNOSIS — M948X9 Other specified disorders of cartilage, unspecified sites: Secondary | ICD-10-CM | POA: Diagnosis not present

## 2013-06-03 DIAGNOSIS — Z0389 Encounter for observation for other suspected diseases and conditions ruled out: Secondary | ICD-10-CM | POA: Diagnosis not present

## 2013-06-04 DIAGNOSIS — C61 Malignant neoplasm of prostate: Secondary | ICD-10-CM | POA: Diagnosis not present

## 2013-06-04 DIAGNOSIS — M899 Disorder of bone, unspecified: Secondary | ICD-10-CM | POA: Diagnosis not present

## 2013-06-04 DIAGNOSIS — C649 Malignant neoplasm of unspecified kidney, except renal pelvis: Secondary | ICD-10-CM | POA: Diagnosis not present

## 2013-06-08 DIAGNOSIS — C649 Malignant neoplasm of unspecified kidney, except renal pelvis: Secondary | ICD-10-CM | POA: Diagnosis not present

## 2013-06-08 DIAGNOSIS — Z8549 Personal history of malignant neoplasm of other male genital organs: Secondary | ICD-10-CM | POA: Diagnosis not present

## 2013-06-08 DIAGNOSIS — N189 Chronic kidney disease, unspecified: Secondary | ICD-10-CM | POA: Diagnosis not present

## 2013-06-08 DIAGNOSIS — M899 Disorder of bone, unspecified: Secondary | ICD-10-CM | POA: Diagnosis not present

## 2013-06-15 DIAGNOSIS — C61 Malignant neoplasm of prostate: Secondary | ICD-10-CM | POA: Diagnosis not present

## 2013-06-18 DIAGNOSIS — Z23 Encounter for immunization: Secondary | ICD-10-CM | POA: Diagnosis not present

## 2013-07-02 DIAGNOSIS — C61 Malignant neoplasm of prostate: Secondary | ICD-10-CM | POA: Diagnosis not present

## 2013-07-02 DIAGNOSIS — C649 Malignant neoplasm of unspecified kidney, except renal pelvis: Secondary | ICD-10-CM | POA: Diagnosis not present

## 2013-07-20 DIAGNOSIS — Z85528 Personal history of other malignant neoplasm of kidney: Secondary | ICD-10-CM | POA: Diagnosis not present

## 2013-07-20 DIAGNOSIS — R972 Elevated prostate specific antigen [PSA]: Secondary | ICD-10-CM | POA: Diagnosis not present

## 2013-07-20 DIAGNOSIS — C61 Malignant neoplasm of prostate: Secondary | ICD-10-CM | POA: Diagnosis not present

## 2013-08-04 DIAGNOSIS — E785 Hyperlipidemia, unspecified: Secondary | ICD-10-CM | POA: Diagnosis not present

## 2013-08-04 DIAGNOSIS — I10 Essential (primary) hypertension: Secondary | ICD-10-CM | POA: Diagnosis not present

## 2013-08-04 DIAGNOSIS — Z125 Encounter for screening for malignant neoplasm of prostate: Secondary | ICD-10-CM | POA: Diagnosis not present

## 2013-08-04 DIAGNOSIS — M899 Disorder of bone, unspecified: Secondary | ICD-10-CM | POA: Diagnosis not present

## 2013-08-10 DIAGNOSIS — M25579 Pain in unspecified ankle and joints of unspecified foot: Secondary | ICD-10-CM | POA: Diagnosis not present

## 2013-08-10 DIAGNOSIS — S82843A Displaced bimalleolar fracture of unspecified lower leg, initial encounter for closed fracture: Secondary | ICD-10-CM | POA: Diagnosis not present

## 2013-08-11 ENCOUNTER — Encounter (HOSPITAL_COMMUNITY): Payer: Self-pay

## 2013-08-12 ENCOUNTER — Encounter (HOSPITAL_COMMUNITY): Payer: Self-pay | Admitting: *Deleted

## 2013-08-12 ENCOUNTER — Other Ambulatory Visit (HOSPITAL_COMMUNITY): Payer: Self-pay | Admitting: *Deleted

## 2013-08-14 ENCOUNTER — Encounter (HOSPITAL_COMMUNITY): Payer: Self-pay

## 2013-08-14 ENCOUNTER — Observation Stay (HOSPITAL_COMMUNITY)
Admission: RE | Admit: 2013-08-14 | Discharge: 2013-08-15 | Disposition: A | Discharge status: Home / Self Care | Payer: Medicare Other | Source: Ambulatory Visit | Attending: Orthopedic Surgery | Admitting: Orthopedic Surgery

## 2013-08-14 ENCOUNTER — Ambulatory Visit (HOSPITAL_COMMUNITY): Payer: Medicare Other | Admitting: Certified Registered"

## 2013-08-14 ENCOUNTER — Encounter (HOSPITAL_COMMUNITY): Payer: Medicare Other | Admitting: Certified Registered"

## 2013-08-14 ENCOUNTER — Other Ambulatory Visit: Payer: Self-pay | Admitting: Orthopedic Surgery

## 2013-08-14 ENCOUNTER — Encounter (HOSPITAL_COMMUNITY)
Admission: RE | Disposition: A | Discharge status: Home / Self Care | Payer: Self-pay | Source: Ambulatory Visit | Attending: Orthopedic Surgery

## 2013-08-14 ENCOUNTER — Ambulatory Visit (HOSPITAL_COMMUNITY): Payer: Medicare Other

## 2013-08-14 DIAGNOSIS — K219 Gastro-esophageal reflux disease without esophagitis: Secondary | ICD-10-CM | POA: Diagnosis not present

## 2013-08-14 DIAGNOSIS — K8020 Calculus of gallbladder without cholecystitis without obstruction: Secondary | ICD-10-CM | POA: Insufficient documentation

## 2013-08-14 DIAGNOSIS — S82851A Displaced trimalleolar fracture of right lower leg, initial encounter for closed fracture: Secondary | ICD-10-CM

## 2013-08-14 DIAGNOSIS — Z9889 Other specified postprocedural states: Secondary | ICD-10-CM | POA: Insufficient documentation

## 2013-08-14 DIAGNOSIS — G8918 Other acute postprocedural pain: Secondary | ICD-10-CM | POA: Diagnosis not present

## 2013-08-14 DIAGNOSIS — R32 Unspecified urinary incontinence: Secondary | ICD-10-CM | POA: Insufficient documentation

## 2013-08-14 DIAGNOSIS — W19XXXA Unspecified fall, initial encounter: Secondary | ICD-10-CM | POA: Insufficient documentation

## 2013-08-14 DIAGNOSIS — S82853A Displaced trimalleolar fracture of unspecified lower leg, initial encounter for closed fracture: Principal | ICD-10-CM

## 2013-08-14 DIAGNOSIS — Z79899 Other long term (current) drug therapy: Secondary | ICD-10-CM | POA: Insufficient documentation

## 2013-08-14 DIAGNOSIS — IMO0002 Reserved for concepts with insufficient information to code with codable children: Secondary | ICD-10-CM | POA: Diagnosis not present

## 2013-08-14 DIAGNOSIS — I10 Essential (primary) hypertension: Secondary | ICD-10-CM | POA: Diagnosis not present

## 2013-08-14 DIAGNOSIS — S82843A Displaced bimalleolar fracture of unspecified lower leg, initial encounter for closed fracture: Secondary | ICD-10-CM | POA: Diagnosis not present

## 2013-08-14 HISTORY — DX: Adverse effect of unspecified anesthetic, initial encounter: T41.45XA

## 2013-08-14 HISTORY — DX: Cardiac murmur, unspecified: R01.1

## 2013-08-14 HISTORY — PX: ORIF ANKLE FRACTURE: SHX5408

## 2013-08-14 HISTORY — DX: Calculus of gallbladder without cholecystitis without obstruction: K80.20

## 2013-08-14 HISTORY — DX: Gastro-esophageal reflux disease without esophagitis: K21.9

## 2013-08-14 HISTORY — DX: Other complications of anesthesia, initial encounter: T88.59XA

## 2013-08-14 LAB — CBC
MCH: 30 pg (ref 26.0–34.0)
MCV: 85.1 fL (ref 78.0–100.0)
Platelets: 137 10*3/uL — ABNORMAL LOW (ref 150–400)
RBC: 4.77 MIL/uL (ref 4.22–5.81)
RDW: 13.6 % (ref 11.5–15.5)
WBC: 6.4 10*3/uL (ref 4.0–10.5)

## 2013-08-14 LAB — BASIC METABOLIC PANEL
CO2: 26 mEq/L (ref 19–32)
Calcium: 9.8 mg/dL (ref 8.4–10.5)
Creatinine, Ser: 1.85 mg/dL — ABNORMAL HIGH (ref 0.50–1.35)
GFR calc Af Amer: 39 mL/min — ABNORMAL LOW (ref >90)
Glucose, Bld: 93 mg/dL (ref 70–99)
Potassium: 4 mEq/L (ref 3.5–5.1)
Sodium: 141 mEq/L (ref 135–145)

## 2013-08-14 SURGERY — OPEN REDUCTION INTERNAL FIXATION (ORIF) ANKLE FRACTURE
Anesthesia: General | Site: Ankle | Laterality: Right | Wound class: Clean

## 2013-08-14 MED ORDER — ACETAMINOPHEN 500 MG PO TABS: 1000.0000 mg | ORAL_TABLET | Freq: Once | ORAL | Status: DC

## 2013-08-14 MED ORDER — FENTANYL CITRATE 0.05 MG/ML IJ SOLN
INTRAMUSCULAR | Status: AC
  Administered 2013-08-14: 50 ug
  Filled 2013-08-14: qty 2

## 2013-08-14 MED ORDER — ENOXAPARIN SODIUM 40 MG/0.4ML ~~LOC~~ SOLN
40.0000 mg | SUBCUTANEOUS | Status: DC
  Administered 2013-08-15: 40 mg via SUBCUTANEOUS
  Filled 2013-08-14: qty 0.4

## 2013-08-14 MED ORDER — ACETAMINOPHEN 500 MG PO TABS
ORAL_TABLET | ORAL | Status: AC
  Administered 2013-08-14: 1000 mg via ORAL
  Filled 2013-08-14: qty 2

## 2013-08-14 MED ORDER — EPHEDRINE SULFATE 50 MG/ML IJ SOLN
INTRAMUSCULAR | Status: DC | PRN
  Administered 2013-08-14 (×2): 5 mg via INTRAVENOUS
  Administered 2013-08-14: 10 mg via INTRAVENOUS
  Administered 2013-08-14: 5 mg via INTRAVENOUS

## 2013-08-14 MED ORDER — MIDAZOLAM HCL 2 MG/2ML IJ SOLN
INTRAMUSCULAR | Status: AC
  Administered 2013-08-14: 1 mg
  Filled 2013-08-14: qty 2

## 2013-08-14 MED ORDER — DEXAMETHASONE SODIUM PHOSPHATE 10 MG/ML IJ SOLN
INTRAMUSCULAR | Status: DC | PRN
  Administered 2013-08-14: 5 mg

## 2013-08-14 MED ORDER — SODIUM CHLORIDE 0.9 % IV SOLN: INTRAVENOUS | Status: DC

## 2013-08-14 MED ORDER — ATENOLOL-CHLORTHALIDONE 50-25 MG PO TABS: 0.5000 | ORAL_TABLET | Freq: Every morning | ORAL | Status: DC

## 2013-08-14 MED ORDER — BUPIVACAINE-EPINEPHRINE PF 0.5-1:200000 % IJ SOLN
INTRAMUSCULAR | Status: DC | PRN
  Administered 2013-08-14: 150 mg via PERINEURAL

## 2013-08-14 MED ORDER — PROMETHAZINE HCL 25 MG/ML IJ SOLN: 6.2500 mg | INTRAMUSCULAR | Status: DC | PRN

## 2013-08-14 MED ORDER — CEFAZOLIN SODIUM-DEXTROSE 2-3 GM-% IV SOLR
INTRAVENOUS | Status: AC
  Filled 2013-08-14: qty 50

## 2013-08-14 MED ORDER — ONDANSETRON HCL 4 MG/2ML IJ SOLN: 4.0000 mg | Freq: Four times a day (QID) | INTRAMUSCULAR | Status: DC | PRN

## 2013-08-14 MED ORDER — ONDANSETRON HCL 4 MG PO TABS: 4.0000 mg | ORAL_TABLET | Freq: Four times a day (QID) | ORAL | Status: DC | PRN

## 2013-08-14 MED ORDER — OXYCODONE HCL 5 MG PO TABS: 5.0000 mg | ORAL_TABLET | Freq: Once | ORAL | Status: DC | PRN

## 2013-08-14 MED ORDER — ATENOLOL 25 MG PO TABS
25.0000 mg | ORAL_TABLET | Freq: Every day | ORAL | Status: DC
  Administered 2013-08-15: 25 mg via ORAL
  Filled 2013-08-14: qty 1

## 2013-08-14 MED ORDER — OXYCODONE HCL 5 MG PO TABS
5.0000 mg | ORAL_TABLET | ORAL | Status: DC | PRN
  Filled 2013-08-14: qty 2

## 2013-08-14 MED ORDER — CALCIPOTRIENE 0.005 % EX CREA: 1.0000 "application " | TOPICAL_CREAM | CUTANEOUS | Status: DC

## 2013-08-14 MED ORDER — SENNA 8.6 MG PO TABS
2.0000 | ORAL_TABLET | Freq: Two times a day (BID) | ORAL | Status: DC
  Administered 2013-08-14 – 2013-08-15 (×3): 17.2 mg via ORAL
  Filled 2013-08-14 (×5): qty 2

## 2013-08-14 MED ORDER — DARIFENACIN HYDROBROMIDE ER 15 MG PO TB24
15.0000 mg | ORAL_TABLET | Freq: Every day | ORAL | Status: DC
Start: 2013-08-14 — End: 2013-08-15
  Administered 2013-08-14 – 2013-08-15 (×2): 15 mg via ORAL
  Filled 2013-08-14 (×2): qty 1

## 2013-08-14 MED ORDER — LOSARTAN POTASSIUM 50 MG PO TABS
50.0000 mg | ORAL_TABLET | Freq: Every morning | ORAL | Status: DC
  Administered 2013-08-15: 50 mg via ORAL
  Filled 2013-08-14: qty 1

## 2013-08-14 MED ORDER — CHLORTHALIDONE 25 MG PO TABS
12.5000 mg | ORAL_TABLET | Freq: Every day | ORAL | Status: DC
  Administered 2013-08-15: 12.5 mg via ORAL
  Filled 2013-08-14: qty 0.5

## 2013-08-14 MED ORDER — HALOBETASOL PROPIONATE 0.05 % EX CREA: 1.0000 "application " | TOPICAL_CREAM | CUTANEOUS | Status: DC

## 2013-08-14 MED ORDER — CHLORHEXIDINE GLUCONATE 4 % EX LIQD: 60.0000 mL | Freq: Once | CUTANEOUS | Status: DC

## 2013-08-14 MED ORDER — OXYCODONE HCL 5 MG PO TABS: 5.0000 mg | ORAL_TABLET | ORAL | Status: DC | PRN

## 2013-08-14 MED ORDER — CEFAZOLIN SODIUM-DEXTROSE 2-3 GM-% IV SOLR
2.0000 g | INTRAVENOUS | Status: AC
  Administered 2013-08-14: 2 g via INTRAVENOUS

## 2013-08-14 MED ORDER — SODIUM CHLORIDE 0.9 % IV SOLN
INTRAVENOUS | Status: DC
  Administered 2013-08-14: 100 mL/h via INTRAVENOUS
  Administered 2013-08-14: 1 mL via INTRAVENOUS

## 2013-08-14 MED ORDER — DOCUSATE SODIUM 100 MG PO CAPS
100.0000 mg | ORAL_CAPSULE | Freq: Two times a day (BID) | ORAL | Status: DC
  Administered 2013-08-14 – 2013-08-15 (×3): 100 mg via ORAL
  Filled 2013-08-14 (×4): qty 1

## 2013-08-14 MED ORDER — ATORVASTATIN CALCIUM 40 MG PO TABS
40.0000 mg | ORAL_TABLET | Freq: Every day | ORAL | Status: DC
  Administered 2013-08-14: 40 mg via ORAL
  Filled 2013-08-14 (×2): qty 1

## 2013-08-14 MED ORDER — FLURANDRENOLIDE 4 MCG/SQCM EX TAPE: 1.0000 | MEDICATED_TAPE | CUTANEOUS | Status: DC

## 2013-08-14 MED ORDER — LACTATED RINGERS IV SOLN
INTRAVENOUS | Status: DC
  Administered 2013-08-14: 11:00:00 via INTRAVENOUS

## 2013-08-14 MED ORDER — 0.9 % SODIUM CHLORIDE (POUR BTL) OPTIME
TOPICAL | Status: DC | PRN
  Administered 2013-08-14: 1000 mL

## 2013-08-14 MED ORDER — ACETAMINOPHEN 500 MG PO TABS: 500.0000 mg | ORAL_TABLET | Freq: Four times a day (QID) | ORAL | Status: DC | PRN

## 2013-08-14 MED ORDER — ONDANSETRON HCL 4 MG/2ML IJ SOLN
INTRAMUSCULAR | Status: DC | PRN
  Administered 2013-08-14: 4 mg via INTRAVENOUS

## 2013-08-14 MED ORDER — PROPOFOL 10 MG/ML IV BOLUS
INTRAVENOUS | Status: DC | PRN
  Administered 2013-08-14: 150 mg via INTRAVENOUS

## 2013-08-14 MED ORDER — HYDROMORPHONE HCL PF 1 MG/ML IJ SOLN: 0.2500 mg | INTRAMUSCULAR | Status: DC | PRN

## 2013-08-14 MED ORDER — OXYCODONE HCL 5 MG/5ML PO SOLN: 5.0000 mg | Freq: Once | ORAL | Status: DC | PRN

## 2013-08-14 MED ORDER — MORPHINE SULFATE 2 MG/ML IJ SOLN
1.0000 mg | INTRAMUSCULAR | Status: DC | PRN
Start: 2013-08-14 — End: 2013-08-15

## 2013-08-14 MED ORDER — ARTIFICIAL TEARS OP OINT
TOPICAL_OINTMENT | OPHTHALMIC | Status: DC | PRN
  Administered 2013-08-14: 1 via OPHTHALMIC

## 2013-08-14 SURGICAL SUPPLY — 67 items
BANDAGE ELASTIC 4 VELCRO ST LF (GAUZE/BANDAGES/DRESSINGS) ×1 IMPLANT
BANDAGE ELASTIC 6 VELCRO ST LF (GAUZE/BANDAGES/DRESSINGS) ×1 IMPLANT
BANDAGE ESMARK 6X9 LF (GAUZE/BANDAGES/DRESSINGS) ×1 IMPLANT
BIT DRILL 2.5X2.75 QC CALB (BIT) ×1 IMPLANT
BIT DRILL 3.5X5.5 QC CALB (BIT) ×1 IMPLANT
BLADE SURG 15 STRL LF DISP TIS (BLADE) ×1 IMPLANT
BLADE SURG 15 STRL SS (BLADE) ×2
BNDG CMPR 9X6 STRL LF SNTH (GAUZE/BANDAGES/DRESSINGS) ×1
BNDG COHESIVE 4X5 TAN STRL (GAUZE/BANDAGES/DRESSINGS) ×2 IMPLANT
BNDG COHESIVE 6X5 TAN STRL LF (GAUZE/BANDAGES/DRESSINGS) ×2 IMPLANT
BNDG ESMARK 6X9 LF (GAUZE/BANDAGES/DRESSINGS) ×2
CHLORAPREP W/TINT 26ML (MISCELLANEOUS) ×2 IMPLANT
CLOTH BEACON ORANGE TIMEOUT ST (SAFETY) ×2 IMPLANT
COVER SURGICAL LIGHT HANDLE (MISCELLANEOUS) ×2 IMPLANT
CUFF TOURNIQUET SINGLE 34IN LL (TOURNIQUET CUFF) ×2 IMPLANT
CUFF TOURNIQUET SINGLE 44IN (TOURNIQUET CUFF) IMPLANT
DRAPE C-ARM 42X72 X-RAY (DRAPES) ×2 IMPLANT
DRAPE C-ARMOR (DRAPES) ×2 IMPLANT
DRAPE U-SHAPE 47X51 STRL (DRAPES) ×2 IMPLANT
DRSG ADAPTIC 3X8 NADH LF (GAUZE/BANDAGES/DRESSINGS) ×1 IMPLANT
DRSG PAD ABDOMINAL 8X10 ST (GAUZE/BANDAGES/DRESSINGS) ×3 IMPLANT
ELECT REM PT RETURN 9FT ADLT (ELECTROSURGICAL) ×2
ELECTRODE REM PT RTRN 9FT ADLT (ELECTROSURGICAL) ×1 IMPLANT
GLOVE BIO SURGEON STRL SZ8 (GLOVE) ×2 IMPLANT
GLOVE BIOGEL PI IND STRL 8 (GLOVE) ×1 IMPLANT
GLOVE BIOGEL PI INDICATOR 8 (GLOVE) ×1
GOWN PREVENTION PLUS XLARGE (GOWN DISPOSABLE) ×2 IMPLANT
GOWN STRL NON-REIN LRG LVL3 (GOWN DISPOSABLE) ×4 IMPLANT
K-WIRE ACE 1.6X6 (WIRE) ×2
KIT BASIN OR (CUSTOM PROCEDURE TRAY) ×2 IMPLANT
KIT ROOM TURNOVER OR (KITS) ×2 IMPLANT
KWIRE ACE 1.6X6 (WIRE) IMPLANT
MANIFOLD NEPTUNE II (INSTRUMENTS) ×2 IMPLANT
NEEDLE 22X1 1/2 (OR ONLY) (NEEDLE) IMPLANT
NS IRRIG 1000ML POUR BTL (IV SOLUTION) ×2 IMPLANT
PACK ORTHO EXTREMITY (CUSTOM PROCEDURE TRAY) ×2 IMPLANT
PAD ARMBOARD 7.5X6 YLW CONV (MISCELLANEOUS) ×4 IMPLANT
PAD CAST 4YDX4 CTTN HI CHSV (CAST SUPPLIES) ×1 IMPLANT
PADDING CAST COTTON 4X4 STRL (CAST SUPPLIES) ×2
PADDING CAST COTTON 6X4 STRL (CAST SUPPLIES) ×1 IMPLANT
PLATE LOCK 7H 92 BILAT FIB (Plate) ×1 IMPLANT
SCREW ACE CAN 4.0 44M (Screw) ×2 IMPLANT
SCREW CORTICAL 3.5MM 24MM (Screw) ×1 IMPLANT
SCREW LOCK 3.5X14 DIST TIB (Screw) ×1 IMPLANT
SCREW LOCK CANC STAR 4X10 (Screw) ×1 IMPLANT
SCREW LOCK CANC STAR 4X16 (Screw) ×1 IMPLANT
SCREW LOCK CANC STAR 4X18 (Screw) ×1 IMPLANT
SCREW LOW PROFILE 18MMX3.5MM (Screw) ×1 IMPLANT
SCREW LOW PROFILE 22MMX3.5MM (Screw) ×1 IMPLANT
SCREW NON LOCKING LP 3.5 16MM (Screw) ×1 IMPLANT
SPLINT PLASTER CAST XFAST 5X30 (CAST SUPPLIES) IMPLANT
SPLINT PLASTER XFAST SET 5X30 (CAST SUPPLIES) ×1
SPONGE GAUZE 4X4 12PLY (GAUZE/BANDAGES/DRESSINGS) ×1 IMPLANT
SPONGE LAP 18X18 X RAY DECT (DISPOSABLE) ×2 IMPLANT
STAPLER VISISTAT 35W (STAPLE) IMPLANT
SUCTION FRAZIER TIP 10 FR DISP (SUCTIONS) ×2 IMPLANT
SUT MNCRL AB 3-0 PS2 18 (SUTURE) IMPLANT
SUT PROLENE 3 0 PS 2 (SUTURE) ×2 IMPLANT
SUT VIC AB 2-0 CT1 27 (SUTURE) ×4
SUT VIC AB 2-0 CT1 TAPERPNT 27 (SUTURE) ×2 IMPLANT
SUT VIC AB 3-0 PS2 18 (SUTURE) ×2
SUT VIC AB 3-0 PS2 18XBRD (SUTURE) ×1 IMPLANT
SYR CONTROL 10ML LL (SYRINGE) IMPLANT
TOWEL OR 17X24 6PK STRL BLUE (TOWEL DISPOSABLE) ×2 IMPLANT
TOWEL OR 17X26 10 PK STRL BLUE (TOWEL DISPOSABLE) ×2 IMPLANT
TUBE CONNECTING 12X1/4 (SUCTIONS) ×2 IMPLANT
WATER STERILE IRR 1000ML POUR (IV SOLUTION) ×2 IMPLANT

## 2013-08-14 NOTE — H&P (Signed)
[name] is an 79 y.o. male.   Chief Complaint: right ankle fracture HPI: 79 y/o male with PMH of prostate cancer fell at home earlier this week.  He was seen in the office and splinted.  xrays reveal an unstable bimalleolar fracture of the right ankle.  He presents now for ORIF.  Past Medical History  Diagnosis Date  . Hypertension   . Headache(784.0)     migraines  . Cancer     ,prostate,kidney right  . Incontinence of urine   . Heart murmur     in college  . Gallstones   . GERD (gastroesophageal reflux disease)     occasional  . Complication of anesthesia     raspy throat after anesth. - always been raspy since     Past Surgical History  Procedure Laterality Date  . Tonsillectomy      age 28  . Nephrectomy  2004    Right  . Postatectomy  2007  . Hernia repair      Left and Right   . Implant fletcher suit / intracavitary radiation application    . Left leg vein procedure blood clot removed      . Colonoscopy with propofol  09/30/2012    Procedure: COLONOSCOPY WITH PROPOFOL;  Surgeon: Charolett Bumpers, MD;  Location: [facility];  Service: Endoscopy;  Laterality: N/A;    History reviewed. No pertinent family history. Social History:  reports that he quit smoking about 55 years ago. He has never used smokeless tobacco. He reports that he drinks alcohol. He reports that he does not use illicit drugs.  Allergies:  Allergies  Allergen Reactions  . Ace Inhibitors Cough  . Alcohol-Sulfur [Sulfur]     Beer, other alcohol causes headaches   . Garlic Other (See Comments)    Headaches    Medications Prior to Admission  Medication Sig Dispense Refill  . acetaminophen (TYLENOL) 500 MG tablet Take 500 mg by mouth every 6 (six) hours as needed.      Marland Kitchen aspirin 81 MG tablet Take 81 mg by mouth daily.      Marland Kitchen atenolol-chlorthalidone (TENORETIC) 50-25 MG per tablet Take 0.5 tablets by mouth every morning.      Marland Kitchen atorvastatin (LIPITOR) 40 MG tablet Take 40 mg by mouth at  bedtime.      . calcipotriene (DOVONOX) 0.005 % cream Apply 1 application topically See admin instructions. He applies at bedtime as needed to scalp for psoriasis alternating with Ultravate Cream every 2 weeks.      . Calcium Carbonate-Vitamin D (CALTRATE 600+D PO) Take 1 tablet by mouth daily after supper.      . Flurandrenolide 4 MCG/SQCM TAPE Apply 1 each topically 2 (two) times a week. As needed for psoriasis      . halobetasol (ULTRAVATE) 0.05 % cream Apply 1 application topically See admin instructions. He applies at bedtime as needed to scalp for psoriasis alternating with Dovonox Cream every 2 weeks.      Marland Kitchen losartan (COZAAR) 50 MG tablet Take 50 mg by mouth every morning.      . solifenacin (VESICARE) 10 MG tablet Take 10 mg by mouth daily.      Marland Kitchen leuprolide (LUPRON) 7.5 MG injection Inject 7.5 mg into the muscle as needed. He takes for his increased prostate specific antigen (PSA) levels.        Results for orders placed during the hospital encounter of 08/14/13 (from the past 48 hour(s))  CBC  Status: Abnormal   Collection Time    08-19-13 10:54 AM      Result Value Range   WBC 6.4  4.0 - 10.5 K/uL   RBC 4.77  4.22 - 5.81 MIL/uL   Hemoglobin 14.3  13.0 - 17.0 g/dL   HCT 21.3  08.6 - 57.8 %   MCV 85.1  78.0 - 100.0 fL   MCH 30.0  26.0 - 34.0 pg   MCHC 35.2  30.0 - 36.0 g/dL   RDW 46.9  62.9 - 52.8 %   Platelets 137 (*) 150 - 400 K/uL  BASIC METABOLIC PANEL     Status: Abnormal   Collection Time    2013-08-19 10:54 AM      Result Value Range   Sodium 141  135 - 145 mEq/L   Potassium 4.0  3.5 - 5.1 mEq/L   Chloride 102  96 - 112 mEq/L   CO2 26  19 - 32 mEq/L   Glucose, Bld 93  70 - 99 mg/dL   BUN 29 (*) 6 - 23 mg/dL   Creatinine, Ser 4.13 (*) 0.50 - 1.35 mg/dL   Calcium 9.8  8.4 - 24.4 mg/dL   GFR calc non Af Amer 33 (*) >90 mL/min   GFR calc Af Amer 39 (*) >90 mL/min   Comment: (NOTE)     The eGFR has been calculated using the CKD EPI equation.     This calculation  has not been validated in all clinical situations.     eGFR's persistently <90 mL/min signify possible Chronic Kidney     Disease.   No results found.  ROS  No recent f/c/v/n/wt loss  Blood pressure 158/74, pulse 79, temperature 97.1 F (36.2 C), temperature source Oral, resp. rate 18, SpO2 100.00%. Physical Exam  wn wd male in nad.  A and O x 4.  Mood and affect normal.  EOMI.  Resp unlabored.  R ankle with healthy skin.  No lymphadenopathy.  2+ dp and pt pulses.  Feels LT normally at the foot.  Assessment/Plan Right ankle bimal fracture - to OR for ORIF.  The risks and benefits of the alternative treatment options have been discussed in detail.  The patient wishes to proceed with surgery and specifically understands risks of bleeding, infection, nerve damage, blood clots, need for additional surgery, amputation and death.   [name] 08/19/2013, 11:51 AM

## 2013-08-14 NOTE — Anesthesia Postprocedure Evaluation (Signed)
Anesthesia Post Note  Patient: [name]  Procedure(s) Performed: Procedure(s) (LRB): OPEN REDUCTION INTERNAL FIXATION (ORIF) RIGHT BIMALLEOLAR ANKLE FRACTURE (Right)  Anesthesia type: general  Patient location: PACU  Post pain: Pain level controlled  Post assessment: Patient's Cardiovascular Status Stable  Last Vitals:  Filed Vitals:   08/14/13 1438  BP: 126/63  Pulse: 73  Temp:   Resp: 23    Post vital signs: Reviewed and stable  Level of consciousness: sedated  Complications: No apparent anesthesia complications

## 2013-08-14 NOTE — Anesthesia Procedure Notes (Addendum)
Anesthesia Regional Block:  Popliteal block  Pre-Anesthetic Checklist: ,, timeout performed, Correct Patient, Correct Site, Correct Laterality, Correct Procedure, Correct Position, site marked, Risks and benefits discussed,  Surgical consent,  Pre-op evaluation,  At surgeon's request and post-op pain management  Laterality: Right  Prep: chloraprep       Needles:  Injection technique: Single-shot  Needle Type: Echogenic Stimulator Needle          Additional Needles:  Procedures: ultrasound guided (picture in chart) and nerve stimulator Popliteal block  Nerve Stimulator or Paresthesia:  Response: plantar flexion, 0.45 mA,   Additional Responses:   Narrative:  Start time: 08/14/2013 11:26 AM End time: 08/14/2013 11:36 AM  Performed by: Personally  Anesthesiologist: J. Adonis Huguenin, MD  Additional Notes: A functioning IV was confirmed and monitors were applied.  Sterile prep and drape, hand hygiene and sterile gloves were used.  Negative aspiration and test dose prior to incremental administration of local anesthetic. The patient tolerated the procedure well.Ultrasound  guidance: relevant anatomy identified, needle position confirmed, local anesthetic spread visualized around nerve(s), vascular puncture avoided.  Image printed for medical record.    Procedure Name: LMA Insertion Date/Time: 08/14/2013 12:08 PM Performed by: Jefm Miles E Pre-anesthesia Checklist: Patient identified, Emergency Drugs available, Suction available, Patient being monitored and Timeout performed Patient Re-evaluated:Patient Re-evaluated prior to inductionOxygen Delivery Method: Circle system utilized Preoxygenation: Pre-oxygenation with 100% oxygen Intubation Type: IV induction LMA: LMA inserted LMA Size: 4.0 Number of attempts: 1 Placement Confirmation: positive ETCO2 and breath sounds checked- equal and bilateral Tube secured with: Tape Dental Injury: Teeth and Oropharynx as per  pre-operative assessment

## 2013-08-14 NOTE — Transfer of Care (Signed)
Immediate Anesthesia Transfer of Care Note  Patient: [name]  Procedure(s) Performed: Procedure(s): OPEN REDUCTION INTERNAL FIXATION (ORIF) RIGHT BIMALLEOLAR ANKLE FRACTURE (Right)  Patient Location: PACU  Anesthesia Type:General  Level of Consciousness: lethargic and responds to stimulation  Airway & Oxygen Therapy: Patient Spontanous Breathing and Patient connected to nasal cannula oxygen  Post-op Assessment: Report given to PACU RN  Post vital signs: Reviewed and stable  Complications: No apparent anesthesia complications

## 2013-08-14 NOTE — Anesthesia Preprocedure Evaluation (Addendum)
Anesthesia Evaluation  Patient identified by MRN, date of birth, ID band Patient awake    Reviewed: Allergy & Precautions, H&P , NPO status , Patient's Chart, lab work & pertinent test results, reviewed documented beta blocker date and time   History of Anesthesia Complications Negative for: history of anesthetic complications  Airway Mallampati: II TM Distance: >3 FB Neck ROM: Full    Dental  (+) Teeth Intact and Dental Advisory Given   Pulmonary former smoker,    Pulmonary exam normal       Cardiovascular hypertension,     Neuro/Psych  Headaches, negative psych ROS   GI/Hepatic Neg liver ROS, GERD-  ,  Endo/Other  negative endocrine ROS  Renal/GU negative Renal ROS     Musculoskeletal   Abdominal   Peds  Hematology   Anesthesia Other Findings   Reproductive/Obstetrics                          Anesthesia Physical Anesthesia Plan  ASA: III  Anesthesia Plan: General   Post-op Pain Management:    Induction: Intravenous  Airway Management Planned: LMA  Additional Equipment:   Intra-op Plan:   Post-operative Plan: Extubation in OR  Informed Consent: I have reviewed the patients History and Physical, chart, labs and discussed the procedure including the risks, benefits and alternatives for the proposed anesthesia with the patient or authorized representative who has indicated his/her understanding and acceptance.   Dental advisory given  Plan Discussed with: CRNA, Anesthesiologist and Surgeon  Anesthesia Plan Comments:        Anesthesia Quick Evaluation

## 2013-08-14 NOTE — Brief Op Note (Signed)
08/14/2013  1:19 PM  PATIENT:  Lars Masson  79 y.o. male  PRE-OPERATIVE DIAGNOSIS:  RIGHT ANKLE BIMALLEOLAR FRACTURE  POST-OPERATIVE DIAGNOSIS:  RIGHT ANKLE trimalleolar FRACTURE  Procedure(s): 1.  ORIF right ankle trimalleolar fracture without fixation of the posterior lip. 2.  Stress exam of right ankle under fluoro  SURGEON:  [name], MD  ASSISTANT: Lorin Picket Flowers, PA-C  ANESTHESIA:   General, regional  EBL:  minimal   TOURNIQUET:  40 min at 250 mm Hg  COMPLICATIONS:  None apparent  DISPOSITION:  Extubated, awake and stable to recovery.  DICTATION ID:  [id number]

## 2013-08-14 NOTE — Preoperative (Signed)
Beta Blockers   Reason not to administer Beta Blockers:Not Applicable, last dose 08/14/13 at 0630 per patient

## 2013-08-15 DIAGNOSIS — I10 Essential (primary) hypertension: Secondary | ICD-10-CM | POA: Diagnosis not present

## 2013-08-15 DIAGNOSIS — Z9889 Other specified postprocedural states: Secondary | ICD-10-CM | POA: Diagnosis not present

## 2013-08-15 DIAGNOSIS — K8020 Calculus of gallbladder without cholecystitis without obstruction: Secondary | ICD-10-CM | POA: Diagnosis not present

## 2013-08-15 DIAGNOSIS — S82853A Displaced trimalleolar fracture of unspecified lower leg, initial encounter for closed fracture: Secondary | ICD-10-CM | POA: Diagnosis not present

## 2013-08-15 DIAGNOSIS — K219 Gastro-esophageal reflux disease without esophagitis: Secondary | ICD-10-CM | POA: Diagnosis not present

## 2013-08-15 DIAGNOSIS — R32 Unspecified urinary incontinence: Secondary | ICD-10-CM | POA: Diagnosis not present

## 2013-08-15 NOTE — Progress Notes (Signed)
Occupational Therapy Evaluation Patient Details Name: [name] MRN: [id number] DOB: 1933-09-02 Today's Date: 08/15/2013 Time: 4098-1191 OT Time Calculation (min): 10 min  OT Assessment / Plan / Recommendation History of present illness Patient fell at home down stairs and sustained bimalleolar fracture of the right ankle, now s/p ORIF    Clinical Impression   PTA, pt independent with all ADL and mobility. Pt anxious to return home. Will see again this am for pt/family education regarding ADL and mobility. Pt asking about using knee walker. Feel pt safer with RW at this time.     OT Assessment  Patient needs continued OT Services    Follow Up Recommendations  No OT follow up    Barriers to Discharge      Equipment Recommendations  None recommended by OT    Recommendations for Other Services    Frequency  Min 2X/week    Precautions / Restrictions Precautions Precautions: Fall Restrictions Weight Bearing Restrictions: Yes RLE Weight Bearing: Non weight bearing   Pertinent Vitals/Pain no apparent distress     ADL  Grooming: Set up Upper Body Bathing: Set up Lower Body Bathing: Minimal assistance Where Assessed - Lower Body Bathing: Supported sit to stand Upper Body Dressing: Set up Where Assessed - Upper Body Dressing: Supported sitting Lower Body Dressing: Moderate assistance Where Assessed - Lower Body Dressing: Supported sit to Pharmacist, hospital: Minimal assistance Toilet Transfer Method: Other (comment) (ambulating) Acupuncturist: Bedside commode Toileting - Clothing Manipulation and Hygiene: Minimal assistance Transfers/Ambulation Related to ADLs: minguard ADL Comments: will benfit from AE/education on compensatory techniques    OT Diagnosis: Generalized weakness;Acute pain  OT Problem List: Decreased strength;Decreased activity tolerance;Impaired balance (sitting and/or standing);Decreased safety awareness;Decreased knowledge of use of DME  or AE;Decreased knowledge of precautions;Pain OT Treatment Interventions: Self-care/ADL training;Energy conservation;DME and/or AE instruction;Therapeutic activities;Patient/family education   OT Goals(Current goals can be found in the care plan section) Acute Rehab OT Goals Patient Stated Goal: to go home OT Goal Formulation: With patient Time For Goal Achievement: 08/22/13 Potential to Achieve Goals: Good  Visit Information  Last OT Received On: 08/15/13 Assistance Needed: +1 History of Present Illness: Patient fell at home down stairs and sustained bimalleolar fracture of the right ankle, now s/p ORIF        Prior Functioning     Home Living Family/patient expects to be discharged to:: Private residence Living Arrangements: Spouse/significant other Available Help at Discharge: Family Type of Home: House Home Access: Stairs to enter Secretary/administrator of Steps: 3 Entrance Stairs-Rails: Right Home Layout: Two level;Able to live on main level with bedroom/bathroom Alternate Level Stairs-Rails: Right Home Equipment: Crutches;Bedside commode Additional Comments: patient really wants to be able to go up to second floor Prior Function Level of Independence: Independent Communication Communication: No difficulties         Vision/Perception Vision - History Baseline Vision: Wears glasses all the time   Cognition  Cognition Arousal/Alertness: Awake/alert Behavior During Therapy: WFL for tasks assessed/performed Overall Cognitive Status: Within Functional Limits for tasks assessed Area of Impairment: Safety/judgement Safety/Judgement: Decreased awareness of safety    Extremity/Trunk Assessment Upper Extremity Assessment Upper Extremity Assessment: Overall WFL for tasks assessed Lower Extremity Assessment Lower Extremity Assessment: Defer to PT evaluation RLE: Unable to fully assess due to immobilization Cervical / Trunk Assessment Cervical / Trunk Assessment:  Normal     Mobility Bed Mobility Bed Mobility: Supine to Sit;Sit to Supine Supine to Sit: 6: Modified independent (Device/Increase time) Sit to  Supine: 6: Modified independent (Device/Increase time) Transfers Transfers: Sit to Stand;Stand to Sit Sit to Stand: 4: Min guard;From chair/3-in-1 Stand to Sit: 4: Min guard;To chair/3-in-1 Details for Transfer Assistance: required cueing for hand placement and min guard due to unsteadiness     Exercise     Balance Balance Balance Assessed: Yes Dynamic Standing Balance Dynamic Standing - Balance Support: Bilateral upper extremity supported (walker) Dynamic Standing - Level of Assistance: 4: Min assist   End of Session OT - End of Session Equipment Utilized During Treatment: Gait belt;Rolling walker Activity Tolerance: Patient tolerated treatment well Patient left: in chair;with call bell/phone within reach;with family/visitor present Nurse Communication: Mobility status  GO Functional Assessment Tool Used: clinical judgement Functional Limitation: Self care Self Care Current Status (Q4696): At least 20 percent but less than 40 percent impaired, limited or restricted Self Care Goal Status (E9528): At least 1 percent but less than 20 percent impaired, limited or restricted   [facility] 08/15/2013, 11:50 AM [facility], OTR/L  [phone] 08/15/2013

## 2013-08-15 NOTE — Progress Notes (Signed)
Physical Therapy Treatment Patient Details Name: [name] MRN: [id number] DOB: April 15, 1933 Today's Date: 08/15/2013 Time: 1050-1110 PT Time Calculation (min): 20 min  PT Assessment / Plan / Recommendation  History of Present Illness Patient fell at home down stairs and sustained bimalleolar fracture of the right ankle, now s/p ORIF    PT Comments   Patient did better with mobility second session.  Still with some unsteadiness and balance concerns and recommend min guard assistance at home.  Patient unsafe to ambulate up and down steps and discussed alternatives with wife.  They have a neighbor with temporary ramp they will borrow to get in house.  Do recommend HHPT to address balance issues and progressive gait.  Discussed with patient and wife to use w/c as primary transportation.  Feel wife has good understanding of limitations and is safe to take patient home.   Follow Up Recommendations  Home health PT;Supervision for mobility/OOB     Does the patient have the potential to tolerate intense rehabilitation     Barriers to Discharge        Equipment Recommendations  Rolling walker with 5" wheels    Recommendations for Other Services    Frequency Min 1X/week   Progress towards PT Goals Progress towards PT goals: Progressing toward goals  Plan Current plan remains appropriate    Precautions / Restrictions Precautions Precautions: Fall   Pertinent Vitals/Pain No pain reported    Mobility  Bed Mobility Bed Mobility: Supine to Sit Supine to Sit: 7: Independent Transfers Transfers: Sit to Stand;Stand to Sit Sit to Stand: 4: Min guard;From bed;With upper extremity assist Stand to Sit: 4: Min guard;To chair/3-in-1;With upper extremity assist Details for Transfer Assistance: required cueing for hand placement and min guard due to unsteadiness Ambulation/Gait Ambulation/Gait Assistance: 4: Min guard Ambulation Distance (Feet): 10 Feet Assistive device: Rolling  walker Ambulation/Gait Assistance Details: patient less unsteady, still feel he needs min assist for balance/safety Gait Pattern: Step-to pattern Stairs: Yes Stairs Assistance: 3: Mod assist Stairs Assistance Details (indicate cue type and reason): performed up/down stairs with one rail and crutch.  patient still required mod assist for safety and physical assistance.  Discussed with patient and wife.  They have neighbor that has temporary ramp they can use.  Recommended ramp for entrance to house.  Did also discuss with wife how to go up stairs in w/c (using 2 person assist to bump w/c up steps).  do not feel patient safe ambulating up stairs. Stair Management Technique: One rail Right;Forwards;With crutches Number of Stairs: 2    Exercises     PT Diagnosis: Difficulty walking  PT Problem List: Decreased balance;Decreased mobility;Decreased safety awareness PT Treatment Interventions: DME instruction;Gait training;Balance training   PT Goals (current goals can now be found in the care plan section) Acute Rehab PT Goals Patient Stated Goal: be able to get upstairs PT Goal Formulation: With patient/family Time For Goal Achievement: 08/16/13 Potential to Achieve Goals: Good  Visit Information  Last PT Received On: 08/15/13 Assistance Needed: +1 History of Present Illness: Patient fell at home down stairs and sustained bimalleolar fracture of the right ankle, now s/p ORIF     Subjective Data  Patient Stated Goal: be able to get upstairs   Cognition  Cognition Arousal/Alertness: Awake/alert Behavior During Therapy: WFL for tasks assessed/performed Overall Cognitive Status: Within Functional Limits for tasks assessed Area of Impairment: Safety/judgement Safety/Judgement: Decreased awareness of safety    Balance  Balance Balance Assessed: Yes Dynamic Standing Balance Dynamic  Standing - Balance Support: Bilateral upper extremity supported (walker) Dynamic Standing - Level of  Assistance: 4: Min assist  End of Session PT - End of Session Equipment Utilized During Treatment: Gait belt Activity Tolerance: Patient tolerated treatment well Patient left: in chair;with family/visitor present;with call bell/phone within reach Nurse Communication: Mobility status   GP Functional Assessment Tool Used: clinical judgement Functional Limitation: Mobility: Walking and moving around Mobility: Walking and Moving Around Current Status (F6213): At least 20 percent but less than 40 percent impaired, limited or restricted Mobility: Walking and Moving Around Goal Status [phone]): At least 1 percent but less than 20 percent impaired, limited or restricted Mobility: Walking and Moving Around Discharge Status [phone]): At least 20 percent but less than 40 percent impaired, limited or restricted   Olivia Canter, [place] [phone] 08/15/2013, 11:16 AM

## 2013-08-15 NOTE — Discharge Summary (Signed)
Physician Discharge Summary   Patient ID: [name] MRN: [id number] DOB/AGE: May 02, 1933 79 y.o.  Admit date: 08/14/2013 Discharge date: 08/15/2013  Admission Diagnoses:  Active Problems:   Trimalleolar fracture of ankle, closed   Discharge Diagnoses:  Same   Surgeries: Procedure(s): OPEN REDUCTION INTERNAL FIXATION (ORIF) RIGHT BIMALLEOLAR ANKLE FRACTURE on 08/14/2013   Consultants: Pt ,OT  Discharged Condition: Stable  Hospital Course: [name] is an 79 y.o. male who was admitted 08/14/2013 with a chief complaint of right ankle pain, and found to have a diagnosis of right ankle fracture.  They were brought to the operating room on 08/14/2013 and underwent the above named procedures.    The patient had an uncomplicated hospital course and was stable for discharge.  Recent vital signs:  Filed Vitals:   08/15/13 0631  BP: 147/92  Pulse: 69  Temp: 98.3 F (36.8 C)  Resp: 18    Recent laboratory studies:  Results for orders placed during the hospital encounter of 08/14/13  CBC      Result Value Range   WBC 6.4  4.0 - 10.5 K/uL   RBC 4.77  4.22 - 5.81 MIL/uL   Hemoglobin 14.3  13.0 - 17.0 g/dL   HCT 40.9  81.1 - 91.4 %   MCV 85.1  78.0 - 100.0 fL   MCH 30.0  26.0 - 34.0 pg   MCHC 35.2  30.0 - 36.0 g/dL   RDW 78.2  95.6 - 21.3 %   Platelets 137 (*) 150 - 400 K/uL  BASIC METABOLIC PANEL      Result Value Range   Sodium 141  135 - 145 mEq/L   Potassium 4.0  3.5 - 5.1 mEq/L   Chloride 102  96 - 112 mEq/L   CO2 26  19 - 32 mEq/L   Glucose, Bld 93  70 - 99 mg/dL   BUN 29 (*) 6 - 23 mg/dL   Creatinine, Ser 0.86 (*) 0.50 - 1.35 mg/dL   Calcium 9.8  8.4 - 57.8 mg/dL   GFR calc non Af Amer 33 (*) >90 mL/min   GFR calc Af Amer 39 (*) >90 mL/min    Discharge Medications:     Medication List    STOP taking these medications       aspirin 81 MG tablet      TAKE these medications       acetaminophen 500 MG tablet  Commonly known as:  TYLENOL  Take  500 mg by mouth every 6 (six) hours as needed.     atenolol-chlorthalidone 50-25 MG per tablet  Commonly known as:  TENORETIC  Take 0.5 tablets by mouth every morning.     atorvastatin 40 MG tablet  Commonly known as:  LIPITOR  Take 40 mg by mouth at bedtime.     calcipotriene 0.005 % cream  Commonly known as:  DOVONOX  Apply 1 application topically See admin instructions. He applies at bedtime as needed to scalp for psoriasis alternating with Ultravate Cream every 2 weeks.     CALTRATE 600+D PO  Take 1 tablet by mouth daily after supper.     Flurandrenolide 4 MCG/SQCM Tape  Apply 1 each topically 2 (two) times a week. As needed for psoriasis     halobetasol 0.05 % cream  Commonly known as:  ULTRAVATE  Apply 1 application topically See admin instructions. He applies at bedtime as needed to scalp for psoriasis alternating with Dovonox Cream every 2 weeks.  leuprolide 7.5 MG injection  Commonly known as:  LUPRON  Inject 7.5 mg into the muscle as needed. He takes for his increased prostate specific antigen (PSA) levels.     losartan 50 MG tablet  Commonly known as:  COZAAR  Take 50 mg by mouth every morning.     oxyCODONE 5 MG immediate release tablet  Commonly known as:  Oxy IR/ROXICODONE  Take 1-2 tablets (5-10 mg total) by mouth every 4 (four) hours as needed for breakthrough pain.     solifenacin 10 MG tablet  Commonly known as:  VESICARE  Take 10 mg by mouth daily.        Diagnostic Studies: Dg Ankle Complete Right  08/14/2013   CLINICAL DATA:  Bimalleolar fracture.  EXAM: RIGHT ANKLE - COMPLETE 3+ VIEW  COMPARISON:  None.  FINDINGS: Patient status post plate and screw fixation of bimalleolar fracture. Good anatomic alignment noted.  IMPRESSION: Good anatomic alignment status post open reduction internal fixation bimalleolar fracture.   Electronically Signed   By: Maisie Fus  Register   On: 08/14/2013 13:27    Disposition: 01-Home or Self Care Will need home health  PT,OT       Follow-up Information   Follow up with [name], MD. Call in 2 weeks.   Specialty:  Orthopedic Surgery   Contact information:   [address] [place] [address] [phone]        Signed: Verlee Rossetti 08/15/2013, 10:02 AM

## 2013-08-15 NOTE — Progress Notes (Signed)
Orthopedics Progress Note  Subjective: I am still numb.  Objective:  Filed Vitals:   08/15/13 0631  BP: 147/92  Pulse: 69  Temp: 98.3 F (36.8 C)  Resp: 18    General: Awake and alert  Musculoskeletal: right ankle elevated, cant wiggle toes due to block, Splint fitting well, toes well perfused. Neurovascularly intact  Lab Results  Component Value Date   WBC 6.4 08/14/2013   HGB 14.3 08/14/2013   HCT 40.6 08/14/2013   MCV 85.1 08/14/2013   PLT 137* 08/14/2013       Component Value Date/Time   NA 141 08/14/2013 1054   NA 142 01/30/2013 1332   K 4.0 08/14/2013 1054   K 3.7 01/30/2013 1332   CL 102 08/14/2013 1054   CL 103 01/30/2013 1332   CO2 26 08/14/2013 1054   CO2 28 01/30/2013 1332   GLUCOSE 93 08/14/2013 1054   GLUCOSE 131* 01/30/2013 1332   BUN 29* 08/14/2013 1054   BUN 28.4* 01/30/2013 1332   CREATININE 1.85* 08/14/2013 1054   CREATININE 2.2* 01/30/2013 1332   CALCIUM 9.8 08/14/2013 1054   CALCIUM 9.4 01/30/2013 1332   GFRNONAA 33* 08/14/2013 1054   GFRAA 39* 08/14/2013 1054    No results found for this basename: INR, PROTIME    Assessment/Plan: POD #1 s/p Procedure(s): OPEN REDUCTION INTERNAL FIXATION (ORIF) RIGHT BIMALLEOLAR ANKLE FRACTURE D/c home after PT, OT  Viviann Spare R. Ranell Patrick, MD 08/15/2013 10:00 AM

## 2013-08-15 NOTE — Op Note (Signed)
NAMECREWE, HEATHMAN NO.:  [id number]  MEDICAL RECORD NO.:  [id number]  LOCATION:  5N09C                        FACILITY:  [facility]  PHYSICIAN:  [name], MD        DATE OF BIRTH:  Nov 19, 1932  DATE OF PROCEDURE:  08/14/2013 DATE OF DISCHARGE:                              OPERATIVE REPORT   PREOPERATIVE DIAGNOSIS:  Right ankle bimalleolar fracture.  POSTOPERATIVE DIAGNOSIS:  Right ankle trimalleolar fracture.  PROCEDURES: 1. Open reduction and internal fixation of right ankle trimalleolar     fracture without fixation of the posterior lip. 2. Stress examination of the right ankle under fluoroscopy.  SURGEON:  [name], MD  ASSISTANT:  Lorin Picket Flowers, PA-C  ANESTHESIA:  General, regional.  ESTIMATED BLOOD LOSS:  Minimal.  TOURNIQUET TIME:  40 minutes at 250 mmHg.  COMPLICATIONS:  None apparent.  DISPOSITION:  Extubated, awake and stable to recovery.  INDICATIONS FOR PROCEDURE:  The patient is a 79 year old male with past medical history significant for prostate cancer.  He fell at home on his stairs earlier this week.  He injured his right ankle and was seen in clinic by Dr. Penni Bombard.  His x-rays revealed a bimalleolar ankle fracture that was displaced and unstable.  He presents now for operative treatment of this injury.  He understands the risks and benefits, the alternative treatment options and elects surgical treatment.  He specifically understands risks of bleeding, infection, nerve damage, blood clots, need for additional surgery, amputation, and death.  PROCEDURE IN DETAIL:  After preoperative consent was obtained and the correct operative site was identified, the patient was brought to the operating room and placed supine on the operating table.  General anesthesia was induced.  Preoperative antibiotics were administered. Surgical time-out was taken.  The right lower extremity was prepped and draped in standard sterile fashion with  tourniquet around the thigh. Extremity was exsanguinated and the tourniquet was inflated to 250 mmHg. A longitudinal incision was then made over the lateral malleolus.  Sharp dissection was carried down through the skin and subcutaneous tissue. Fracture site was identified.  There was cleaned of all hematoma and periosteum.  The fracture was reduced and held with a tenaculum.  A 3.5- mm fully-threaded lag screw was then inserted from anterior to posterior across the fracture site.  It was noted to have adequate purchase.  A composite plate from the [employer] ALPS plate set was selected.  It was contoured to fit the lateral malleolus and then secured distally with three locking screws and proximally with two bicortical nonlocking screws and one locking screw.  AP and lateral radiographs showed appropriate reduction of the lateral malleolus fracture and appropriate position and length of all hardware.  Attention was then turned to the medial malleolus.  Two K-wires were inserted percutaneously across the medial malleolus fracture site.  AP and lateral radiographs confirmed appropriate position of both guidepins.  Stab incisions were then made over both guidepins and 4-mm x 44-mm partially-threaded cannulated screws were inserted over the guidewires into the medial malleolus.  Both were noted to have excellent purchase.  AP and lateral radiographs confirmed appropriate position and length of all hardware  and appropriate reduction of the fractures.  Both guidepins were removed.  Both wounds were then irrigated copiously. Final AP, lateral, and mortise radiographs were obtained showing appropriate position and length of all hardware and appropriate reduction of both fracture sites.  A dorsiflexion of external rotation stress view was then obtained on the mortise radiographic view.  These showed no evidence of widening of the medial clear space or the syndesmosis.  Lateral incision was then  closed with inverted simple sutures of 3-0 Monocryl to approximate the subcutaneous tissue and a running 3-0 nylon to close the skin incision.  Two medial stab incisions were closed with horizontal mattress sutures of 3-0 nylon.  Sterile dressings were applied followed by a well-padded short-leg splint. Tourniquet was released approximately 40 minutes.  The patient was then awakened from anesthesia and transported to the recovery room in stable condition.  FOLLOWUP PLAN:  The patient will be observed overnight for pain control. He will be discharged home after physical therapy.  He will follow up with me in 2 weeks for suture removal and conversion to a cast.   Scott Flowers, PA-C was present and scrubbed for the duration of the case.  His assistance was essential to gaining and maintaining exposure, performing the operation, closing and dressing the wounds and applying the splint.  [name], MD     JH/MEDQ  D:  08/14/2013  T:  08/15/2013  Job:  [id number]

## 2013-08-15 NOTE — Progress Notes (Signed)
Occupational Therapy Treatment Patient Details Name: [name] MRN: [id number] DOB: 02-01-1933 Today's Date: 08/15/2013 Time: 4098-1191 OT Time Calculation (min): 27 min  OT Assessment / Plan / Recommendation  History of present illness Patient fell at home down stairs and sustained bimalleolar fracture of the right ankle, now s/p ORIF    OT comments  All education completed. Pt ready for D/C. OT signing off. Thanks  Follow Up Recommendations  No OT follow up    Barriers to Discharge       Equipment Recommendations  None recommended by OT    Recommendations for Other Services    Frequency Min 2X/week   Progress towards OT Goals Progress towards OT goals: Goals met/education completed, patient discharged from OT  Plan Discharge plan remains appropriate    Precautions / Restrictions Precautions Precautions: Fall Restrictions Weight Bearing Restrictions: Yes RLE Weight Bearing: Non weight bearing   Pertinent Vitals/Pain no apparent distress     ADL  Grooming: Set up Upper Body Bathing: Set up Lower Body Bathing: Minimal assistance Where Assessed - Lower Body Bathing: Supported sit to stand Upper Body Dressing: Set up Where Assessed - Upper Body Dressing: Supported sitting Lower Body Dressing: Moderate assistance Where Assessed - Lower Body Dressing: Supported sit to Pharmacist, hospital: Minimal assistance Toilet Transfer Method: Other (comment) (ambulating) Acupuncturist: Bedside commode Toileting - Clothing Manipulation and Hygiene: Minimal assistance Transfers/Ambulation Related to ADLs: minguard ADL Comments: Educated pt/wife on use of AE for LB ADL. Educated on home safety and home modification to increase safety and reduce risk of falls. Pt has w/c in home. Rec for pt to use w/c in home when he is fatigues. Also recommended for pt to use urinal at night to reduce risk of falls. Pt/wife demonstrated understanding.Completed ADL session with wife.  Questions answered.    OT Diagnosis: Generalized weakness;Acute pain  OT Problem List: Decreased strength;Decreased activity tolerance;Impaired balance (sitting and/or standing);Decreased safety awareness;Decreased knowledge of use of DME or AE;Decreased knowledge of precautions;Pain OT Treatment Interventions: Self-care/ADL training;Energy conservation;DME and/or AE instruction;Therapeutic activities;Patient/family education   OT Goals(current goals can now be found in the care plan section) Acute Rehab OT Goals Patient Stated Goal: to go home OT Goal Formulation: With patient Time For Goal Achievement: 08/22/13 Potential to Achieve Goals: Good ADL Goals Pt Will Perform Lower Body Bathing: with caregiver independent in assisting;with supervision;sit to/from stand Pt Will Perform Lower Body Dressing: with supervision;with caregiver independent in assisting;sit to/from stand Pt Will Transfer to Toilet: with supervision;ambulating;bedside commode  Visit Information  Last OT Received On: 08/15/13 Assistance Needed: +1 History of Present Illness: Patient fell at home down stairs and sustained bimalleolar fracture of the right ankle, now s/p ORIF     Subjective Data      Prior Functioning  Home Living Family/patient expects to be discharged to:: Private residence Living Arrangements: Spouse/significant other Available Help at Discharge: Family Type of Home: House Home Access: Stairs to enter Secretary/administrator of Steps: 3 Entrance Stairs-Rails: Right Home Layout: Two level;Able to live on main level with bedroom/bathroom Alternate Level Stairs-Rails: Right Home Equipment: Crutches;Bedside commode Additional Comments: patient really wants to be able to go up to second floor Prior Function Level of Independence: Independent Communication Communication: No difficulties    Cognition  Cognition Arousal/Alertness: Awake/alert Behavior During Therapy: WFL for tasks  assessed/performed Overall Cognitive Status: Within Functional Limits for tasks assessed Area of Impairment: Safety/judgement Safety/Judgement: Decreased awareness of safety    Mobility  Bed Mobility  Bed Mobility: Supine to Sit;Sit to Supine Supine to Sit: 6: Modified independent (Device/Increase time) Sit to Supine: 6: Modified independent (Device/Increase time) Transfers Transfers: Sit to Stand;Stand to Sit Sit to Stand: 5: Supervision Stand to Sit: 5: Supervision Details for Transfer Assistance: vc for safety during ADL tasks    Exercises      Balance Balance Balance Assessed: Yes Dynamic Standing Balance Dynamic Standing - Balance Support: Bilateral upper extremity supported (walker) Dynamic Standing - Level of Assistance: 4: Min assist   End of Session OT - End of Session Equipment Utilized During Treatment: Gait belt;Rolling walker Activity Tolerance: Patient tolerated treatment well Patient left: in chair;with call bell/phone within reach;with family/visitor present Nurse Communication: Mobility status  GO Functional Assessment Tool Used: clinical judgement Functional Limitation: Self care Self Care Current Status ([id number]): At least 1 percent but less than 20 percent impaired, limited or restricted Self Care Discharge Status [phone]): At least 1 percent but less than 20 percent impaired, limited or restricted   [facility] 08/15/2013, 11:58 AM [facility], OTR/L  [phone] 08/15/2013

## 2013-08-15 NOTE — Progress Notes (Signed)
Utilization Review Completed.   Encarnacion Bole, RN, BSN Nurse Case Manager  

## 2013-08-15 NOTE — Progress Notes (Signed)
   CARE MANAGEMENT NOTE 08/15/2013  Patient:  WEN, MERCED   Account Number:  [id number]  Date Initiated:  08/15/2013  Documentation initiated by:  [facility]  Subjective/Objective Assessment:   adm: Patient fell at home down stairs and sustained bimalleolar fracture of the right ankle, now s/p ORIF     Action/Plan:   discharge planning   Anticipated DC Date:  08/15/2013   Anticipated DC Plan:  HOME W HOME HEALTH SERVICES      DC Planning Services  CM consult      [facility] Choice  HOME HEALTH   Choice offered to / List presented to:  C-1 Patient   DME arranged  Levan Hurst      DME agency  [facility]     HH arranged  HH-2 PT      [facility] An agency  [facility]   Status of service:  Completed, signed off Medicare Important Message given?   (If response is "NO", the following Medicare IM given date fields will be blank) Date Medicare IM given:   Date Additional Medicare IM given:    Discharge Disposition:  HOME W HOME HEALTH SERVICES  Per UR Regulation:    If discussed at Long Length of Stay Meetings, dates discussed:    Comments:  08/15/10:20 Cm spoke with pt to offer choice.  Pt will receive HHPT from [facility].  Referral called to rep who is on campus.  Rolling walker to be delivered to room prior to discharge.  No other CM needs were communicated.  Idolina Primer, CM [phone].

## 2013-08-15 NOTE — Evaluation (Signed)
Physical Therapy Evaluation Patient Details Name: [name] MRN: [id number] DOB: 06-Jun-1933 Today's Date: 08/15/2013 Time: 0910-0940 PT Time Calculation (min): 30 min  PT Assessment / Plan / Recommendation History of Present Illness  Patient fell at home down stairs and sustained bimalleolar fracture of the right ankle, now s/p ORIF   Clinical Impression  Patient s/p ORIF.  Patient did well with mobility and was very unsteady with gait.  Would like to see patient for one more visit to ensure safety with walker and practice steps.  Feel patient will be ok to d/c today with w/c at home and supervision for mobility.    PT Assessment  Patient needs continued PT services    Follow Up Recommendations  Home health PT;Supervision for mobility/OOB    Does the patient have the potential to tolerate intense rehabilitation      Barriers to Discharge        Equipment Recommendations  Rolling walker with 5" wheels    Recommendations for Other Services     Frequency Min 1X/week    Precautions / Restrictions Precautions Precautions: Fall   Pertinent Vitals/Pain No pain      Mobility  Bed Mobility Bed Mobility: Supine to Sit Supine to Sit: 7: Independent Transfers Transfers: Sit to Stand;Stand to Sit Sit to Stand: 4: Min guard;From bed;With upper extremity assist Stand to Sit: 4: Min guard;To chair/3-in-1;With upper extremity assist Details for Transfer Assistance: required cueing for hand placement and min guard due to unsteadiness Ambulation/Gait Ambulation/Gait Assistance: 4: Min assist Ambulation Distance (Feet): 20 Feet Assistive device: Rolling walker Ambulation/Gait Assistance Details: pt required assist due to unsteadiness, required assist for balance Gait Pattern: Step-to pattern    Exercises     PT Diagnosis: Difficulty walking  PT Problem List: Decreased balance;Decreased mobility;Decreased safety awareness PT Treatment Interventions: DME instruction;Gait  training;Balance training     PT Goals(Current goals can be found in the care plan section) Acute Rehab PT Goals Patient Stated Goal: be able to get upstairs PT Goal Formulation: With patient/family Time For Goal Achievement: 08/16/13 Potential to Achieve Goals: Good  Visit Information  Last PT Received On: 08/15/13 Assistance Needed: +1 History of Present Illness: Patient fell at home down stairs and sustained bimalleolar fracture of the right ankle, now s/p ORIF        Prior Functioning  Home Living Family/patient expects to be discharged to:: Private residence Living Arrangements: Spouse/significant other Available Help at Discharge: Family Type of Home: House Home Access: Stairs to enter Secretary/administrator of Steps: 3 Entrance Stairs-Rails: Right Home Layout: Two level;Able to live on main level with bedroom/bathroom Alternate Level Stairs-Rails: Right Home Equipment: Crutches Additional Comments: patient really wants to be able to go up to second floor Prior Function Level of Independence: Independent Communication Communication: No difficulties    Cognition  Cognition Arousal/Alertness: Awake/alert Behavior During Therapy: WFL for tasks assessed/performed Overall Cognitive Status: Within Functional Limits for tasks assessed Area of Impairment: Safety/judgement Safety/Judgement: Decreased awareness of safety    Extremity/Trunk Assessment Upper Extremity Assessment Upper Extremity Assessment: Defer to OT evaluation Lower Extremity Assessment Lower Extremity Assessment: RLE deficits/detail RLE: Unable to fully assess due to immobilization   Balance Balance Balance Assessed: Yes Dynamic Standing Balance Dynamic Standing - Balance Support: Bilateral upper extremity supported (walker) Dynamic Standing - Level of Assistance: 4: Min assist  End of Session PT - End of Session Equipment Utilized During Treatment: Gait belt Activity Tolerance: Patient tolerated  treatment well Patient left: in chair;with  family/visitor present;with call bell/phone within reach Nurse Communication: Mobility status  GP Functional Assessment Tool Used: clinical judgement Functional Limitation: Mobility: Walking and moving around Mobility: Walking and Moving Around Current Status [phone]): At least 20 percent but less than 40 percent impaired, limited or restricted Mobility: Walking and Moving Around Goal Status [phone]): At least 1 percent but less than 20 percent impaired, limited or restricted   Olivia Canter, [place] [phone] 08/15/2013, 9:47 AM

## 2013-08-18 ENCOUNTER — Encounter (HOSPITAL_COMMUNITY): Payer: Self-pay | Admitting: Orthopedic Surgery

## 2013-08-18 DIAGNOSIS — Z5189 Encounter for other specified aftercare: Secondary | ICD-10-CM | POA: Diagnosis not present

## 2013-08-18 DIAGNOSIS — S8290XD Unspecified fracture of unspecified lower leg, subsequent encounter for closed fracture with routine healing: Secondary | ICD-10-CM | POA: Diagnosis not present

## 2013-08-18 DIAGNOSIS — R269 Unspecified abnormalities of gait and mobility: Secondary | ICD-10-CM | POA: Diagnosis not present

## 2013-08-18 DIAGNOSIS — I129 Hypertensive chronic kidney disease with stage 1 through stage 4 chronic kidney disease, or unspecified chronic kidney disease: Secondary | ICD-10-CM | POA: Diagnosis not present

## 2013-08-18 DIAGNOSIS — N189 Chronic kidney disease, unspecified: Secondary | ICD-10-CM | POA: Diagnosis not present

## 2013-08-20 DIAGNOSIS — S8290XD Unspecified fracture of unspecified lower leg, subsequent encounter for closed fracture with routine healing: Secondary | ICD-10-CM | POA: Diagnosis not present

## 2013-08-20 DIAGNOSIS — N189 Chronic kidney disease, unspecified: Secondary | ICD-10-CM | POA: Diagnosis not present

## 2013-08-20 DIAGNOSIS — I129 Hypertensive chronic kidney disease with stage 1 through stage 4 chronic kidney disease, or unspecified chronic kidney disease: Secondary | ICD-10-CM | POA: Diagnosis not present

## 2013-08-20 DIAGNOSIS — Z5189 Encounter for other specified aftercare: Secondary | ICD-10-CM | POA: Diagnosis not present

## 2013-08-20 DIAGNOSIS — R269 Unspecified abnormalities of gait and mobility: Secondary | ICD-10-CM | POA: Diagnosis not present

## 2013-08-21 DIAGNOSIS — R269 Unspecified abnormalities of gait and mobility: Secondary | ICD-10-CM | POA: Diagnosis not present

## 2013-08-21 DIAGNOSIS — S8290XD Unspecified fracture of unspecified lower leg, subsequent encounter for closed fracture with routine healing: Secondary | ICD-10-CM | POA: Diagnosis not present

## 2013-08-21 DIAGNOSIS — Z5189 Encounter for other specified aftercare: Secondary | ICD-10-CM | POA: Diagnosis not present

## 2013-08-21 DIAGNOSIS — N189 Chronic kidney disease, unspecified: Secondary | ICD-10-CM | POA: Diagnosis not present

## 2013-08-21 DIAGNOSIS — I129 Hypertensive chronic kidney disease with stage 1 through stage 4 chronic kidney disease, or unspecified chronic kidney disease: Secondary | ICD-10-CM | POA: Diagnosis not present

## 2013-08-25 DIAGNOSIS — S8290XD Unspecified fracture of unspecified lower leg, subsequent encounter for closed fracture with routine healing: Secondary | ICD-10-CM | POA: Diagnosis not present

## 2013-08-25 DIAGNOSIS — Z5189 Encounter for other specified aftercare: Secondary | ICD-10-CM | POA: Diagnosis not present

## 2013-08-25 DIAGNOSIS — R269 Unspecified abnormalities of gait and mobility: Secondary | ICD-10-CM | POA: Diagnosis not present

## 2013-08-25 DIAGNOSIS — N189 Chronic kidney disease, unspecified: Secondary | ICD-10-CM | POA: Diagnosis not present

## 2013-08-25 DIAGNOSIS — I129 Hypertensive chronic kidney disease with stage 1 through stage 4 chronic kidney disease, or unspecified chronic kidney disease: Secondary | ICD-10-CM | POA: Diagnosis not present

## 2013-08-27 DIAGNOSIS — Z5189 Encounter for other specified aftercare: Secondary | ICD-10-CM | POA: Diagnosis not present

## 2013-08-27 DIAGNOSIS — S8290XD Unspecified fracture of unspecified lower leg, subsequent encounter for closed fracture with routine healing: Secondary | ICD-10-CM | POA: Diagnosis not present

## 2013-08-27 DIAGNOSIS — R269 Unspecified abnormalities of gait and mobility: Secondary | ICD-10-CM | POA: Diagnosis not present

## 2013-08-27 DIAGNOSIS — N189 Chronic kidney disease, unspecified: Secondary | ICD-10-CM | POA: Diagnosis not present

## 2013-08-27 DIAGNOSIS — I129 Hypertensive chronic kidney disease with stage 1 through stage 4 chronic kidney disease, or unspecified chronic kidney disease: Secondary | ICD-10-CM | POA: Diagnosis not present

## 2013-09-01 DIAGNOSIS — I129 Hypertensive chronic kidney disease with stage 1 through stage 4 chronic kidney disease, or unspecified chronic kidney disease: Secondary | ICD-10-CM | POA: Diagnosis not present

## 2013-09-01 DIAGNOSIS — S8290XD Unspecified fracture of unspecified lower leg, subsequent encounter for closed fracture with routine healing: Secondary | ICD-10-CM | POA: Diagnosis not present

## 2013-09-01 DIAGNOSIS — Z5189 Encounter for other specified aftercare: Secondary | ICD-10-CM | POA: Diagnosis not present

## 2013-09-01 DIAGNOSIS — R269 Unspecified abnormalities of gait and mobility: Secondary | ICD-10-CM | POA: Diagnosis not present

## 2013-09-01 DIAGNOSIS — N189 Chronic kidney disease, unspecified: Secondary | ICD-10-CM | POA: Diagnosis not present

## 2013-09-04 DIAGNOSIS — N189 Chronic kidney disease, unspecified: Secondary | ICD-10-CM | POA: Diagnosis not present

## 2013-09-04 DIAGNOSIS — S8290XD Unspecified fracture of unspecified lower leg, subsequent encounter for closed fracture with routine healing: Secondary | ICD-10-CM | POA: Diagnosis not present

## 2013-09-04 DIAGNOSIS — R269 Unspecified abnormalities of gait and mobility: Secondary | ICD-10-CM | POA: Diagnosis not present

## 2013-09-04 DIAGNOSIS — I129 Hypertensive chronic kidney disease with stage 1 through stage 4 chronic kidney disease, or unspecified chronic kidney disease: Secondary | ICD-10-CM | POA: Diagnosis not present

## 2013-09-04 DIAGNOSIS — Z5189 Encounter for other specified aftercare: Secondary | ICD-10-CM | POA: Diagnosis not present

## 2013-09-25 DIAGNOSIS — S8290XD Unspecified fracture of unspecified lower leg, subsequent encounter for closed fracture with routine healing: Secondary | ICD-10-CM | POA: Diagnosis not present

## 2013-09-25 DIAGNOSIS — S82843A Displaced bimalleolar fracture of unspecified lower leg, initial encounter for closed fracture: Secondary | ICD-10-CM | POA: Diagnosis not present

## 2013-10-01 DIAGNOSIS — C649 Malignant neoplasm of unspecified kidney, except renal pelvis: Secondary | ICD-10-CM | POA: Diagnosis not present

## 2013-10-01 DIAGNOSIS — C61 Malignant neoplasm of prostate: Secondary | ICD-10-CM | POA: Diagnosis not present

## 2013-10-15 DIAGNOSIS — Z5189 Encounter for other specified aftercare: Secondary | ICD-10-CM | POA: Diagnosis not present

## 2013-10-15 DIAGNOSIS — I129 Hypertensive chronic kidney disease with stage 1 through stage 4 chronic kidney disease, or unspecified chronic kidney disease: Secondary | ICD-10-CM | POA: Diagnosis not present

## 2013-10-15 DIAGNOSIS — S8290XD Unspecified fracture of unspecified lower leg, subsequent encounter for closed fracture with routine healing: Secondary | ICD-10-CM | POA: Diagnosis not present

## 2013-10-15 DIAGNOSIS — R269 Unspecified abnormalities of gait and mobility: Secondary | ICD-10-CM | POA: Diagnosis not present

## 2013-10-15 DIAGNOSIS — N189 Chronic kidney disease, unspecified: Secondary | ICD-10-CM | POA: Diagnosis not present

## 2013-10-30 DIAGNOSIS — S8290XD Unspecified fracture of unspecified lower leg, subsequent encounter for closed fracture with routine healing: Secondary | ICD-10-CM | POA: Diagnosis not present

## 2013-10-30 DIAGNOSIS — S82843A Displaced bimalleolar fracture of unspecified lower leg, initial encounter for closed fracture: Secondary | ICD-10-CM | POA: Diagnosis not present

## 2013-11-16 DIAGNOSIS — Z8553 Personal history of malignant neoplasm of renal pelvis: Secondary | ICD-10-CM | POA: Diagnosis not present

## 2013-11-16 DIAGNOSIS — R972 Elevated prostate specific antigen [PSA]: Secondary | ICD-10-CM | POA: Diagnosis not present

## 2013-11-16 DIAGNOSIS — I10 Essential (primary) hypertension: Secondary | ICD-10-CM | POA: Diagnosis not present

## 2013-11-16 DIAGNOSIS — Z7982 Long term (current) use of aspirin: Secondary | ICD-10-CM | POA: Diagnosis not present

## 2013-11-16 DIAGNOSIS — N3941 Urge incontinence: Secondary | ICD-10-CM | POA: Diagnosis not present

## 2013-11-16 DIAGNOSIS — C61 Malignant neoplasm of prostate: Secondary | ICD-10-CM | POA: Diagnosis not present

## 2013-11-16 DIAGNOSIS — Z79899 Other long term (current) drug therapy: Secondary | ICD-10-CM | POA: Diagnosis not present

## 2013-11-18 DIAGNOSIS — M25519 Pain in unspecified shoulder: Secondary | ICD-10-CM | POA: Diagnosis not present

## 2013-11-20 DIAGNOSIS — M810 Age-related osteoporosis without current pathological fracture: Secondary | ICD-10-CM | POA: Diagnosis not present

## 2013-11-20 DIAGNOSIS — C61 Malignant neoplasm of prostate: Secondary | ICD-10-CM | POA: Diagnosis not present

## 2013-11-24 DIAGNOSIS — L408 Other psoriasis: Secondary | ICD-10-CM | POA: Diagnosis not present

## 2013-11-24 DIAGNOSIS — M25579 Pain in unspecified ankle and joints of unspecified foot: Secondary | ICD-10-CM | POA: Diagnosis not present

## 2013-11-26 DIAGNOSIS — M25579 Pain in unspecified ankle and joints of unspecified foot: Secondary | ICD-10-CM | POA: Diagnosis not present

## 2013-12-01 DIAGNOSIS — M25579 Pain in unspecified ankle and joints of unspecified foot: Secondary | ICD-10-CM | POA: Diagnosis not present

## 2013-12-03 DIAGNOSIS — M25579 Pain in unspecified ankle and joints of unspecified foot: Secondary | ICD-10-CM | POA: Diagnosis not present

## 2013-12-04 DIAGNOSIS — S8290XD Unspecified fracture of unspecified lower leg, subsequent encounter for closed fracture with routine healing: Secondary | ICD-10-CM | POA: Diagnosis not present

## 2013-12-04 DIAGNOSIS — M25579 Pain in unspecified ankle and joints of unspecified foot: Secondary | ICD-10-CM | POA: Diagnosis not present

## 2013-12-09 DIAGNOSIS — R110 Nausea: Secondary | ICD-10-CM | POA: Diagnosis not present

## 2013-12-09 DIAGNOSIS — I10 Essential (primary) hypertension: Secondary | ICD-10-CM | POA: Diagnosis not present

## 2013-12-09 DIAGNOSIS — IMO0002 Reserved for concepts with insufficient information to code with codable children: Secondary | ICD-10-CM | POA: Diagnosis not present

## 2013-12-09 DIAGNOSIS — Z1331 Encounter for screening for depression: Secondary | ICD-10-CM | POA: Diagnosis not present

## 2013-12-09 DIAGNOSIS — N183 Chronic kidney disease, stage 3 unspecified: Secondary | ICD-10-CM | POA: Diagnosis not present

## 2013-12-15 DIAGNOSIS — R55 Syncope and collapse: Secondary | ICD-10-CM | POA: Diagnosis not present

## 2013-12-15 DIAGNOSIS — R0609 Other forms of dyspnea: Secondary | ICD-10-CM | POA: Diagnosis not present

## 2013-12-15 DIAGNOSIS — R110 Nausea: Secondary | ICD-10-CM | POA: Diagnosis not present

## 2013-12-15 DIAGNOSIS — C61 Malignant neoplasm of prostate: Secondary | ICD-10-CM | POA: Diagnosis not present

## 2013-12-21 DIAGNOSIS — C61 Malignant neoplasm of prostate: Secondary | ICD-10-CM | POA: Diagnosis not present

## 2014-01-14 DIAGNOSIS — C61 Malignant neoplasm of prostate: Secondary | ICD-10-CM | POA: Diagnosis not present

## 2014-01-14 DIAGNOSIS — R110 Nausea: Secondary | ICD-10-CM | POA: Diagnosis not present

## 2014-01-14 DIAGNOSIS — M949 Disorder of cartilage, unspecified: Secondary | ICD-10-CM | POA: Diagnosis not present

## 2014-01-14 DIAGNOSIS — M899 Disorder of bone, unspecified: Secondary | ICD-10-CM | POA: Diagnosis not present

## 2014-01-20 DIAGNOSIS — N2581 Secondary hyperparathyroidism of renal origin: Secondary | ICD-10-CM | POA: Diagnosis not present

## 2014-01-20 DIAGNOSIS — D631 Anemia in chronic kidney disease: Secondary | ICD-10-CM | POA: Diagnosis not present

## 2014-01-20 DIAGNOSIS — N183 Chronic kidney disease, stage 3 unspecified: Secondary | ICD-10-CM | POA: Diagnosis not present

## 2014-02-11 DIAGNOSIS — R5381 Other malaise: Secondary | ICD-10-CM | POA: Diagnosis not present

## 2014-02-11 DIAGNOSIS — C61 Malignant neoplasm of prostate: Secondary | ICD-10-CM | POA: Diagnosis not present

## 2014-02-11 DIAGNOSIS — Z85528 Personal history of other malignant neoplasm of kidney: Secondary | ICD-10-CM | POA: Diagnosis not present

## 2014-02-11 DIAGNOSIS — R110 Nausea: Secondary | ICD-10-CM | POA: Diagnosis not present

## 2014-03-18 DIAGNOSIS — R110 Nausea: Secondary | ICD-10-CM | POA: Diagnosis not present

## 2014-03-18 DIAGNOSIS — C61 Malignant neoplasm of prostate: Secondary | ICD-10-CM | POA: Diagnosis not present

## 2014-03-18 DIAGNOSIS — R5381 Other malaise: Secondary | ICD-10-CM | POA: Diagnosis not present

## 2014-04-22 DIAGNOSIS — C649 Malignant neoplasm of unspecified kidney, except renal pelvis: Secondary | ICD-10-CM | POA: Diagnosis not present

## 2014-04-22 DIAGNOSIS — C61 Malignant neoplasm of prostate: Secondary | ICD-10-CM | POA: Diagnosis not present

## 2014-05-28 DIAGNOSIS — L218 Other seborrheic dermatitis: Secondary | ICD-10-CM | POA: Diagnosis not present

## 2014-05-28 DIAGNOSIS — L408 Other psoriasis: Secondary | ICD-10-CM | POA: Diagnosis not present

## 2014-05-31 DIAGNOSIS — C61 Malignant neoplasm of prostate: Secondary | ICD-10-CM | POA: Diagnosis not present

## 2014-05-31 DIAGNOSIS — N3946 Mixed incontinence: Secondary | ICD-10-CM | POA: Diagnosis not present

## 2014-05-31 DIAGNOSIS — C649 Malignant neoplasm of unspecified kidney, except renal pelvis: Secondary | ICD-10-CM | POA: Diagnosis not present

## 2014-06-08 DIAGNOSIS — Z23 Encounter for immunization: Secondary | ICD-10-CM | POA: Diagnosis not present

## 2014-06-29 DIAGNOSIS — R531 Weakness: Secondary | ICD-10-CM | POA: Diagnosis not present

## 2014-06-29 DIAGNOSIS — R5383 Other fatigue: Secondary | ICD-10-CM | POA: Diagnosis not present

## 2014-06-29 DIAGNOSIS — R634 Abnormal weight loss: Secondary | ICD-10-CM | POA: Diagnosis not present

## 2014-06-29 DIAGNOSIS — Z7982 Long term (current) use of aspirin: Secondary | ICD-10-CM | POA: Diagnosis not present

## 2014-06-29 DIAGNOSIS — C61 Malignant neoplasm of prostate: Secondary | ICD-10-CM | POA: Diagnosis not present

## 2014-06-30 DIAGNOSIS — C61 Malignant neoplasm of prostate: Secondary | ICD-10-CM | POA: Diagnosis not present

## 2014-07-06 DIAGNOSIS — H2513 Age-related nuclear cataract, bilateral: Secondary | ICD-10-CM | POA: Diagnosis not present

## 2014-07-06 DIAGNOSIS — H5213 Myopia, bilateral: Secondary | ICD-10-CM | POA: Diagnosis not present

## 2014-07-06 DIAGNOSIS — H35373 Puckering of macula, bilateral: Secondary | ICD-10-CM | POA: Diagnosis not present

## 2014-07-29 DIAGNOSIS — H268 Other specified cataract: Secondary | ICD-10-CM | POA: Diagnosis not present

## 2014-07-29 DIAGNOSIS — H25042 Posterior subcapsular polar age-related cataract, left eye: Secondary | ICD-10-CM | POA: Diagnosis not present

## 2014-07-29 DIAGNOSIS — H25812 Combined forms of age-related cataract, left eye: Secondary | ICD-10-CM | POA: Diagnosis not present

## 2014-07-29 DIAGNOSIS — H2512 Age-related nuclear cataract, left eye: Secondary | ICD-10-CM | POA: Diagnosis not present

## 2014-08-24 DIAGNOSIS — I10 Essential (primary) hypertension: Secondary | ICD-10-CM | POA: Diagnosis not present

## 2014-08-24 DIAGNOSIS — C61 Malignant neoplasm of prostate: Secondary | ICD-10-CM | POA: Diagnosis not present

## 2014-08-24 DIAGNOSIS — R634 Abnormal weight loss: Secondary | ICD-10-CM | POA: Diagnosis not present

## 2014-08-24 DIAGNOSIS — Z7982 Long term (current) use of aspirin: Secondary | ICD-10-CM | POA: Diagnosis not present

## 2014-08-27 DIAGNOSIS — H3531 Nonexudative age-related macular degeneration: Secondary | ICD-10-CM | POA: Diagnosis not present

## 2014-08-27 DIAGNOSIS — H35373 Puckering of macula, bilateral: Secondary | ICD-10-CM | POA: Diagnosis not present

## 2014-09-14 DIAGNOSIS — Z125 Encounter for screening for malignant neoplasm of prostate: Secondary | ICD-10-CM | POA: Diagnosis not present

## 2014-09-14 DIAGNOSIS — E785 Hyperlipidemia, unspecified: Secondary | ICD-10-CM | POA: Diagnosis not present

## 2014-09-14 DIAGNOSIS — I10 Essential (primary) hypertension: Secondary | ICD-10-CM | POA: Diagnosis not present

## 2014-09-14 DIAGNOSIS — Z008 Encounter for other general examination: Secondary | ICD-10-CM | POA: Diagnosis not present

## 2014-09-21 DIAGNOSIS — C61 Malignant neoplasm of prostate: Secondary | ICD-10-CM | POA: Diagnosis not present

## 2014-09-21 DIAGNOSIS — E785 Hyperlipidemia, unspecified: Secondary | ICD-10-CM | POA: Diagnosis not present

## 2014-09-21 DIAGNOSIS — H6123 Impacted cerumen, bilateral: Secondary | ICD-10-CM | POA: Diagnosis not present

## 2014-09-21 DIAGNOSIS — Z681 Body mass index (BMI) 19 or less, adult: Secondary | ICD-10-CM | POA: Diagnosis not present

## 2014-09-21 DIAGNOSIS — Z1389 Encounter for screening for other disorder: Secondary | ICD-10-CM | POA: Diagnosis not present

## 2014-09-21 DIAGNOSIS — Z1212 Encounter for screening for malignant neoplasm of rectum: Secondary | ICD-10-CM | POA: Diagnosis not present

## 2014-09-21 DIAGNOSIS — N183 Chronic kidney disease, stage 3 (moderate): Secondary | ICD-10-CM | POA: Diagnosis not present

## 2014-09-21 DIAGNOSIS — C649 Malignant neoplasm of unspecified kidney, except renal pelvis: Secondary | ICD-10-CM | POA: Diagnosis not present

## 2014-09-21 DIAGNOSIS — R5383 Other fatigue: Secondary | ICD-10-CM | POA: Diagnosis not present

## 2014-09-21 DIAGNOSIS — E46 Unspecified protein-calorie malnutrition: Secondary | ICD-10-CM | POA: Diagnosis not present

## 2014-09-21 DIAGNOSIS — I10 Essential (primary) hypertension: Secondary | ICD-10-CM | POA: Diagnosis not present

## 2014-09-21 DIAGNOSIS — Z0000 Encounter for general adult medical examination without abnormal findings: Secondary | ICD-10-CM | POA: Diagnosis not present

## 2014-09-24 DIAGNOSIS — H903 Sensorineural hearing loss, bilateral: Secondary | ICD-10-CM | POA: Diagnosis not present

## 2014-09-24 DIAGNOSIS — H6123 Impacted cerumen, bilateral: Secondary | ICD-10-CM | POA: Diagnosis not present

## 2014-09-27 ENCOUNTER — Encounter: Payer: Self-pay | Admitting: Podiatry

## 2014-09-27 ENCOUNTER — Ambulatory Visit (INDEPENDENT_AMBULATORY_CARE_PROVIDER_SITE_OTHER): Payer: Medicare Other | Admitting: Podiatry

## 2014-09-27 VITALS — BP 151/87 | HR 55 | Resp 16

## 2014-09-27 DIAGNOSIS — Q667 Congenital pes cavus: Secondary | ICD-10-CM

## 2014-09-27 DIAGNOSIS — L84 Corns and callosities: Secondary | ICD-10-CM

## 2014-09-27 DIAGNOSIS — M216X9 Other acquired deformities of unspecified foot: Secondary | ICD-10-CM

## 2014-09-27 NOTE — Progress Notes (Signed)
Subjective:     Patient ID: [name], male   DOB: [birth year], 81 y.o.   MRN: [id number]  HPI patient presents with structural abnormalities of both feet and lesions on the bottom of both feet that are painful   Review of Systems  All other systems reviewed and are negative.      Objective:   Physical Exam  Constitutional: He is oriented to person, place, and time.  Cardiovascular: Intact distal pulses.   Musculoskeletal: Normal range of motion.  Neurological: He is oriented to person, place, and time.  Skin: Skin is warm and dry.  Nursing note and vitals reviewed.  neurovascular status intact with muscle strength slightly reduced and range of motion reduced of the subtalar midtarsal joint. Lanter flexed first metatarsal of both feet are present with thick keratotic lesion sub-1 sub-5 of both feet that are painful. Digital perfusion is good and patient is well oriented 3     Assessment:     Plantar keratotic lesion secondary to structural abnormality    Plan:     H&P and condition explained to patient and debridement accomplished. This will be done periodically on an as-needed basis

## 2014-09-27 NOTE — Progress Notes (Signed)
   Subjective:    Patient ID: [name], male    DOB: [birth year], 81 y.o.   MRN: [id number]  HPI Comments: "I have calluses"  Patient c/o thick callused areas plantar forefoot bilateral for few months. Starting to become painful to walk. No home treatment.     Review of Systems  Constitutional: Positive for fatigue and unexpected weight change.  Endocrine: Positive for cold intolerance.  Musculoskeletal: Positive for gait problem.  Skin: Positive for rash.  Hematological: Bruises/bleeds easily.  All other systems reviewed and are negative.      Objective:   Physical Exam        Assessment & Plan:

## 2014-11-02 DIAGNOSIS — C7951 Secondary malignant neoplasm of bone: Secondary | ICD-10-CM | POA: Diagnosis not present

## 2014-11-02 DIAGNOSIS — C61 Malignant neoplasm of prostate: Secondary | ICD-10-CM | POA: Diagnosis not present

## 2014-11-02 DIAGNOSIS — I10 Essential (primary) hypertension: Secondary | ICD-10-CM | POA: Diagnosis not present

## 2014-11-18 ENCOUNTER — Ambulatory Visit (INDEPENDENT_AMBULATORY_CARE_PROVIDER_SITE_OTHER): Payer: Medicare Other | Admitting: Podiatry

## 2014-11-18 ENCOUNTER — Encounter: Payer: Self-pay | Admitting: Podiatry

## 2014-11-18 VITALS — BP 146/80 | HR 50

## 2014-11-18 DIAGNOSIS — L84 Corns and callosities: Secondary | ICD-10-CM | POA: Diagnosis not present

## 2014-11-18 NOTE — Progress Notes (Signed)
Subjective:     Patient ID: [name], male   DOB: Feb 06, 1933, 81 y.o.   MRN: [id number]  HPI patient presents with plantar callus formation of both feet that are painful   Review of Systems     Objective:   Physical Exam Neurovascular status intact with no change in health history    Assessment:     Plantarflexed metatarsals with lesion formation    Plan:     Debridement lesions on both feet with no iatrogenic bleeding noted

## 2014-11-26 DIAGNOSIS — L400 Psoriasis vulgaris: Secondary | ICD-10-CM | POA: Diagnosis not present

## 2014-11-29 DIAGNOSIS — C649 Malignant neoplasm of unspecified kidney, except renal pelvis: Secondary | ICD-10-CM | POA: Diagnosis not present

## 2014-11-29 DIAGNOSIS — N3946 Mixed incontinence: Secondary | ICD-10-CM | POA: Diagnosis not present

## 2014-11-29 DIAGNOSIS — C61 Malignant neoplasm of prostate: Secondary | ICD-10-CM | POA: Diagnosis not present

## 2014-12-28 DIAGNOSIS — C641 Malignant neoplasm of right kidney, except renal pelvis: Secondary | ICD-10-CM | POA: Diagnosis not present

## 2014-12-28 DIAGNOSIS — C61 Malignant neoplasm of prostate: Secondary | ICD-10-CM | POA: Diagnosis not present

## 2014-12-28 DIAGNOSIS — N189 Chronic kidney disease, unspecified: Secondary | ICD-10-CM | POA: Diagnosis not present

## 2014-12-28 DIAGNOSIS — Z7982 Long term (current) use of aspirin: Secondary | ICD-10-CM | POA: Diagnosis not present

## 2014-12-31 DIAGNOSIS — C61 Malignant neoplasm of prostate: Secondary | ICD-10-CM | POA: Diagnosis not present

## 2015-02-17 ENCOUNTER — Ambulatory Visit (INDEPENDENT_AMBULATORY_CARE_PROVIDER_SITE_OTHER): Payer: Medicare Other | Admitting: Podiatry

## 2015-02-17 ENCOUNTER — Encounter: Payer: Self-pay | Admitting: Podiatry

## 2015-02-17 VITALS — BP 133/55 | HR 61 | Resp 18

## 2015-02-17 DIAGNOSIS — L84 Corns and callosities: Secondary | ICD-10-CM

## 2015-02-18 NOTE — Progress Notes (Signed)
Subjective:     Patient ID: [name], male   DOB: July 06, 1933, 81 y.o.   MRN: [id number]  HPI patient presents with painful keratotic lesion that he cannot cut   Review of Systems     Objective:   Physical Exam Neurovascular status intact with thick keratotic lesion plantar aspect right foot with pain when palpated    Assessment:     Plantar corn callus formation right    Plan:     Debride lesion with no iatrogenic bleeding noted and reappoint to recheck

## 2015-03-01 DIAGNOSIS — H35373 Puckering of macula, bilateral: Secondary | ICD-10-CM | POA: Diagnosis not present

## 2015-03-01 DIAGNOSIS — H3531 Nonexudative age-related macular degeneration: Secondary | ICD-10-CM | POA: Diagnosis not present

## 2015-03-01 DIAGNOSIS — H25811 Combined forms of age-related cataract, right eye: Secondary | ICD-10-CM | POA: Diagnosis not present

## 2015-03-08 DIAGNOSIS — Z79899 Other long term (current) drug therapy: Secondary | ICD-10-CM | POA: Diagnosis not present

## 2015-03-08 DIAGNOSIS — I10 Essential (primary) hypertension: Secondary | ICD-10-CM | POA: Diagnosis not present

## 2015-03-08 DIAGNOSIS — C61 Malignant neoplasm of prostate: Secondary | ICD-10-CM | POA: Diagnosis not present

## 2015-03-08 DIAGNOSIS — Z888 Allergy status to other drugs, medicaments and biological substances status: Secondary | ICD-10-CM | POA: Diagnosis not present

## 2015-03-08 DIAGNOSIS — R5383 Other fatigue: Secondary | ICD-10-CM | POA: Diagnosis not present

## 2015-03-08 DIAGNOSIS — Z7982 Long term (current) use of aspirin: Secondary | ICD-10-CM | POA: Diagnosis not present

## 2015-03-11 DIAGNOSIS — N2581 Secondary hyperparathyroidism of renal origin: Secondary | ICD-10-CM | POA: Diagnosis not present

## 2015-03-11 DIAGNOSIS — N183 Chronic kidney disease, stage 3 (moderate): Secondary | ICD-10-CM | POA: Diagnosis not present

## 2015-03-11 DIAGNOSIS — D631 Anemia in chronic kidney disease: Secondary | ICD-10-CM | POA: Diagnosis not present

## 2015-04-19 DIAGNOSIS — Z85528 Personal history of other malignant neoplasm of kidney: Secondary | ICD-10-CM | POA: Diagnosis not present

## 2015-04-19 DIAGNOSIS — Z9079 Acquired absence of other genital organ(s): Secondary | ICD-10-CM | POA: Diagnosis not present

## 2015-04-19 DIAGNOSIS — Z923 Personal history of irradiation: Secondary | ICD-10-CM | POA: Diagnosis not present

## 2015-04-19 DIAGNOSIS — C61 Malignant neoplasm of prostate: Secondary | ICD-10-CM | POA: Diagnosis not present

## 2015-04-19 DIAGNOSIS — Z905 Acquired absence of kidney: Secondary | ICD-10-CM | POA: Diagnosis not present

## 2015-04-19 DIAGNOSIS — Z79899 Other long term (current) drug therapy: Secondary | ICD-10-CM | POA: Diagnosis not present

## 2015-05-18 ENCOUNTER — Emergency Department (HOSPITAL_COMMUNITY)
Admission: EM | Admit: 2015-05-18 | Discharge: 2015-05-18 | Disposition: A | Discharge status: Home / Self Care | Payer: Medicare Other | Attending: Emergency Medicine | Admitting: Emergency Medicine

## 2015-05-18 ENCOUNTER — Encounter (HOSPITAL_COMMUNITY): Payer: Self-pay | Admitting: Emergency Medicine

## 2015-05-18 DIAGNOSIS — Z23 Encounter for immunization: Secondary | ICD-10-CM | POA: Insufficient documentation

## 2015-05-18 DIAGNOSIS — K219 Gastro-esophageal reflux disease without esophagitis: Secondary | ICD-10-CM | POA: Insufficient documentation

## 2015-05-18 DIAGNOSIS — Z7982 Long term (current) use of aspirin: Secondary | ICD-10-CM | POA: Diagnosis not present

## 2015-05-18 DIAGNOSIS — R011 Cardiac murmur, unspecified: Secondary | ICD-10-CM | POA: Insufficient documentation

## 2015-05-18 DIAGNOSIS — Z8546 Personal history of malignant neoplasm of prostate: Secondary | ICD-10-CM | POA: Insufficient documentation

## 2015-05-18 DIAGNOSIS — Z203 Contact with and (suspected) exposure to rabies: Secondary | ICD-10-CM | POA: Insufficient documentation

## 2015-05-18 DIAGNOSIS — I10 Essential (primary) hypertension: Secondary | ICD-10-CM | POA: Insufficient documentation

## 2015-05-18 DIAGNOSIS — Z87891 Personal history of nicotine dependence: Secondary | ICD-10-CM | POA: Insufficient documentation

## 2015-05-18 DIAGNOSIS — Z79899 Other long term (current) drug therapy: Secondary | ICD-10-CM | POA: Diagnosis not present

## 2015-05-18 MED ORDER — RABIES VACCINE, PCEC IM SUSR
1.0000 mL | Freq: Once | INTRAMUSCULAR | Status: AC
  Administered 2015-05-18: 1 mL via INTRAMUSCULAR
  Filled 2015-05-18: qty 1

## 2015-05-18 MED ORDER — TETANUS-DIPHTH-ACELL PERTUSSIS 5-2.5-18.5 LF-MCG/0.5 IM SUSP
0.5000 mL | Freq: Once | INTRAMUSCULAR | Status: AC
  Administered 2015-05-18: 0.5 mL via INTRAMUSCULAR
  Filled 2015-05-18: qty 0.5

## 2015-05-18 MED ORDER — RABIES IMMUNE GLOBULIN 150 UNIT/ML IM INJ
20.0000 [IU]/kg | INJECTION | Freq: Once | INTRAMUSCULAR | Status: AC
  Administered 2015-05-18: 1200 [IU] via INTRAMUSCULAR
  Filled 2015-05-18: qty 8

## 2015-05-18 NOTE — ED Provider Notes (Signed)
[name]: [id number]     Arrival date & time 05/18/15  1035 History   First MD Initiated Contact with Patient 05/18/15 1059     Chief Complaint  Patient presents with  . Rabies Exposure     found a bat in the house x4 days ago, no bites/scratches or known injury     (Consider location/radiation/quality/duration/timing/severity/associated sxs/prior Treatment) HPI [name] is a 81 y.o. male Who comes in with his wife for rabies exposure. Patient states that last Sunday evening the found a bat flying throughout the house.They are unsure if the bat was present while they were sleeping They deny any obvious bite wounds or exposures They report calling their PCP who recommended him come to the ED for rabies prophylaxis.They deny any fevers, chills, nausea or vomiting, abdominal pain, rash chest pain or shortness of breath.  No history of rabies exposure prophylaxis in the past. Unsure of tetanus  Past Medical History  Diagnosis Date  . Hypertension   . Headache(784.0)     migraines  . Cancer     ,prostate,kidney right  . Incontinence of urine   . Heart murmur     in college  . Gallstones   . GERD (gastroesophageal reflux disease)     occasional  . Complication of anesthesia     raspy throat after anesth. - always been raspy since    Past Surgical History  Procedure Laterality Date  . Tonsillectomy      age 70  . Nephrectomy  2004    Right  . Postatectomy  2007  . Hernia repair      Left and Right   . Implant fletcher suit / intracavitary radiation application    . Left leg vein procedure blood clot removed      . Colonoscopy with propofol  09/30/2012    Procedure: COLONOSCOPY WITH PROPOFOL;  Surgeon: Garlan Fair, MD;  Location: [facility];  Service: Endoscopy;  Laterality: N/A;  . Orif ankle fracture Right 08/14/2013    Procedure: OPEN REDUCTION INTERNAL FIXATION (ORIF) RIGHT BIMALLEOLAR ANKLE FRACTURE;  Surgeon: Wylene Simmer, MD;  Location: [place];  Service: Orthopedics;   Laterality: Right;   No family history on file. Social History  Substance Use Topics  . Smoking status: Former Smoker    Quit date: 08/12/1958  . Smokeless tobacco: Never Used  . Alcohol Use: Yes     Comment: occasional    Review of Systems A 10 point review of systems was completed and was negative except for pertinent positives and negatives as mentioned in the history of present illness     Allergies  Ace inhibitors; Alcohol-sulfur; and Garlic  Home Medications   Prior to Admission medications   Medication Sig Start Date End Date Taking? Authorizing Provider  aspirin 81 MG tablet Take 81 mg by mouth daily.    Historical Provider, MD  atenolol-chlorthalidone (TENORETIC) 50-25 MG per tablet Take 0.5 tablets by mouth every morning.    Historical Provider, MD  atorvastatin (LIPITOR) 40 MG tablet Take 40 mg by mouth at bedtime.    Historical Provider, MD  calcipotriene (DOVONOX) 0.005 % cream Apply 1 application topically See admin instructions. He applies at bedtime as needed to scalp for psoriasis alternating with Ultravate Cream every 2 weeks.    Historical Provider, MD  Calcium Carbonate-Vitamin D (CALTRATE 600+D PO) Take 1 tablet by mouth daily after supper.    Historical Provider, MD  Enzalutamide (XTANDI PO) Take by mouth.  Historical Provider, MD  halobetasol (ULTRAVATE) 0.05 % cream Apply 1 application topically See admin instructions. He applies at bedtime as needed to scalp for psoriasis alternating with Dovonox Cream every 2 weeks.    Historical Provider, MD  leuprolide (LUPRON) 7.5 MG injection Inject 7.5 mg into the muscle as needed. He takes for his increased prostate specific antigen (PSA) levels.    Historical Provider, MD  losartan (COZAAR) 50 MG tablet Take 50 mg by mouth every morning.    Historical Provider, MD  ondansetron (ZOFRAN) 4 MG tablet Take 4 mg by mouth every 8 (eight) hours as needed for nausea or vomiting.    Historical Provider, MD   BP 137/68  mmHg  Pulse 59  Temp(Src) 98.1 F (36.7 C) (Oral)  Resp 18  Ht 6' (1.829 m)  Wt 132 lb (59.875 kg)  BMI 17.90 kg/m2  SpO2 99% Physical Exam  Constitutional:  Awake, alert, nontoxic appearance.  HENT:  Head: Atraumatic.  Eyes: Right eye exhibits no discharge. Left eye exhibits no discharge.  Neck: Neck supple.  Pulmonary/Chest: Effort normal. He exhibits no tenderness.  Abdominal: Soft. There is no tenderness. There is no rebound.  Musculoskeletal: He exhibits no tenderness.  Baseline ROM, no obvious new focal weakness.  Neurological:  Mental status and motor strength appears baseline for patient and situation.  Skin: Skin is warm. No rash noted. No erythema.  No wounds noted  Psychiatric: He has a normal mood and affect.  Nursing note and vitals reviewed.   ED Course  Procedures (including critical care time) Labs Review Labs Reviewed - No data to display  Imaging Review No results found. I have personally reviewed and evaluated these images and lab results as part of my medical decision-making.   EKG Interpretation None     Meds given in ED:  Medications  rabies immune globulin (HYPERAB) injection 1,200 Units (1,200 Units Intramuscular Given 05/18/15 1223)  rabies vaccine (RABAVERT) injection 1 mL (1 mL Intramuscular Given 05/18/15 1223)  Tdap (BOOSTRIX) injection 0.5 mL (0.5 mLs Intramuscular Given 05/18/15 1222)    Discharge Medication List as of 05/18/2015  1:10 PM     Filed Vitals:   05/18/15 1055 05/18/15 1315  BP: 150/86 137/68  Pulse: 61 59  Temp: 98.1 F (36.7 C)   TempSrc: Oral   Resp: 18 18  Height: 6' (1.829 m)   Weight: 132 lb (59.875 kg)   SpO2: 100% 99%    MDM  Patient presents to ED after possible exposure to rabid bat. Given rabies vaccine and immunoglobulin in the ED. Tetanus also updated. No obvious bites noted. Vital signs remained stable, patient will be able to follow-up with PCP/urgent care for remaining vaccine regimen. No evidence  of other acute or emergent pathology at this time. Final diagnoses:  Exposure to rabies       Comer Locket, PA-C 05/18/15 2107  Pattricia Boss, MD 05/19/15 [phone]

## 2015-05-18 NOTE — Discharge Instructions (Signed)
Rabies  Rabies is a viral infection that can be spread to people from infected animals. The infection affects the brain and central nervous system. Once the disease develops, it almost always causes death. Because of this, when a person is bitten by an animal that may have rabies, treatment to prevent rabies often needs to be started whether or not the animal is known to be infected. Prompt treatment with the rabies vaccine and rabies immune globulin is very effective at preventing the infection from developing in people who have been exposed to the rabies virus. CAUSES  Rabies is caused by a virus that lives inside some animals. When a person is bitten by an infected animal, the rabies virus is spread to the person through the infected spit (saliva) of the animal. This virus can be carried by animals such as dogs, cats, skunks, bats, woodchucks, raccoons, coyotes, and foxes. SYMPTOMS  By the time symptoms appear, rabies is usually fatal for the person. Common symptoms include:  Headache.  Fever.  Fatigue and weakness.  Agitation.  Anxiety.  Confusion.  Unusual behavior, such as hyperactivity, fear of water (hydrophobia), or fear of air (aerophobia).  Hallucinations.  Insomnia.  Weakness in the arms or legs.  Difficulty swallowing. Most people get sick in 1-3 months after being bitten. This often varies and may depend on the location of the bite. The infection will take less time to develop if the bite occurred closer to the head.  DIAGNOSIS  To determine if a person is infected, several tests must be performed, such as:  A skin biopsy.  A saliva test.  A lumbar puncture to remove spinal fluid so it can be examined.  Blood tests. TREATMENT  Treatment to prevent the infection from developing (post-exposure prophylaxis, PEP) is often started before knowing for sure if the person has been exposed to the rabies virus. PEP involves cleaning the wound, giving an antibody injection  (rabies immune globulin), and giving a series of rabies vaccine injections. The series of injections are usually given over a two-week period. If possible, the animal that bit the person will be observed to see if it remains healthy. If the animal has been killed, it can be sent to a state laboratory and examined to see if the animal had rabies. If a person is bitten by a domestic animal (dog, cat, or ferret) that appears healthy and can be observed to see if it remains healthy, often no further treatment is necessary other than care of the wounds caused by the animal. Rabies is often a fatal illness once the infection develops in a person. Although a few people who developed rabies have survived after experimental treatment with certain drugs, all these survivors still had severe nervous system problems after the treatment. This is why caregivers use extra caution and begin PEP treatment for people who have been bitten by animals that are possibly infected with rabies.  HOME CARE INSTRUCTIONS  If you were bitten by an unknown animal, make sure you know your caregiver's instructions for follow-up. If the animal was sent to a laboratory for examination, ask when the test results will be ready. Make sure you get the test results.  Take these steps to care for your wound:  Keep the wound clean, dry, and dressed as directed by your caregiver.  Keep the injured part elevated as much as possible.  Do not resume use of the affected area until directed.  Only take over-the-counter or prescription medicines as directed by   your caregiver.  Keep all follow-up appointments as directed by your caregiver. PREVENTION  To prevent rabies, people need to reduce their risk of having contact with infected animals.   Make sure your pets (dogs, cats, ferrets) are vaccinated against rabies. Keep these vaccinations up-to-date as directed by your veterinarian.  Supervise your pets when they are outside. Keep them away  from wild animals.  Call your local animal control services to report any stray animals. These animals may not be vaccinated.  Stay away from stray or wild animals.  Consider getting the rabies vaccine (preexposure) if you are traveling to an area where rabies is common or if your job or activities involve possible contact with wild or stray animals. Discuss this with your caregiver. Document Released: 09/03/2005 Document Revised: 05/28/2012 Document Reviewed: 04/01/2012 Exit[employer] Patient Information 2015 [employer]. This information is not intended to replace advice given to you by your health care provider. Make sure you discuss any questions you have with your health care provider.  

## 2015-05-18 NOTE — ED Notes (Signed)
Pt A+Ox4, reports found a bat in the house x4 days ago, denies contact/injury.  Sts called PCP this morning and was told to come to ER for rabies injection.  Pt denies complaints.  Skin PWD.  MAEI, ambulatory with steady gait.  NAD.

## 2015-05-25 ENCOUNTER — Encounter (HOSPITAL_COMMUNITY): Payer: Self-pay | Admitting: *Deleted

## 2015-05-25 ENCOUNTER — Emergency Department (INDEPENDENT_AMBULATORY_CARE_PROVIDER_SITE_OTHER)
Admission: EM | Admit: 2015-05-25 | Discharge: 2015-05-25 | Disposition: A | Discharge status: Home / Self Care | Payer: Medicare Other | Source: Home / Self Care

## 2015-05-25 DIAGNOSIS — Z23 Encounter for immunization: Secondary | ICD-10-CM

## 2015-05-25 DIAGNOSIS — Z203 Contact with and (suspected) exposure to rabies: Secondary | ICD-10-CM

## 2015-05-25 MED ORDER — RABIES VACCINE, PCEC IM SUSR
INTRAMUSCULAR | Status: AC
  Filled 2015-05-25: qty 1

## 2015-05-25 MED ORDER — RABIES VACCINE, PCEC IM SUSR
1.0000 mL | Freq: Once | INTRAMUSCULAR | Status: AC
  Administered 2015-05-25: 1 mL via INTRAMUSCULAR

## 2015-05-25 NOTE — ED Provider Notes (Signed)
[name]: [id number]     Arrival date & time 05/25/15  1525 History   None    Chief Complaint  Patient presents with  . Rabies Injection   (Consider location/radiation/quality/duration/timing/severity/associated sxs/prior Treatment) Patient is a 81 y.o. male presenting with general illness. The history is provided by the patient.  Illness Chronicity:  New Context:  Pt inadvertantly put on pre-exposure rabies vacc schedule instead of post exposure, will readjust after discussion with pharmacy.   Past Medical History  Diagnosis Date  . Hypertension   . Headache(784.0)     migraines  . Cancer     ,prostate,kidney right  . Incontinence of urine   . Heart murmur     in college  . Gallstones   . GERD (gastroesophageal reflux disease)     occasional  . Complication of anesthesia     raspy throat after anesth. - always been raspy since    Past Surgical History  Procedure Laterality Date  . Tonsillectomy      age 6  . Nephrectomy  2004    Right  . Postatectomy  2007  . Hernia repair      Left and Right   . Implant fletcher suit / intracavitary radiation application    . Left leg vein procedure blood clot removed      . Colonoscopy with propofol  09/30/2012    Procedure: COLONOSCOPY WITH PROPOFOL;  Surgeon: Garlan Fair, MD;  Location: [facility];  Service: Endoscopy;  Laterality: N/A;  . Orif ankle fracture Right 08/14/2013    Procedure: OPEN REDUCTION INTERNAL FIXATION (ORIF) RIGHT BIMALLEOLAR ANKLE FRACTURE;  Surgeon: Wylene Simmer, MD;  Location: [place];  Service: Orthopedics;  Laterality: Right;   History reviewed. No pertinent family history. Social History  Substance Use Topics  . Smoking status: Former Smoker    Quit date: 08/12/1958  . Smokeless tobacco: Never Used  . Alcohol Use: Yes     Comment: occasional    Review of Systems  Allergies  Ace inhibitors; Alcohol-sulfur; and Garlic  Home Medications   Prior to Admission medications   Medication Sig Start  Date End Date Taking? Authorizing Provider  aspirin 81 MG tablet Take 81 mg by mouth daily.    Historical Provider, MD  atenolol-chlorthalidone (TENORETIC) 50-25 MG per tablet Take 0.5 tablets by mouth every morning.    Historical Provider, MD  atorvastatin (LIPITOR) 40 MG tablet Take 40 mg by mouth at bedtime.    Historical Provider, MD  calcipotriene (DOVONOX) 0.005 % cream Apply 1 application topically See admin instructions. He applies at bedtime as needed to scalp for psoriasis alternating with Ultravate Cream every 2 weeks.    Historical Provider, MD  Calcium Carbonate-Vitamin D (CALTRATE 600+D PO) Take 1 tablet by mouth daily after supper.    Historical Provider, MD  Enzalutamide (XTANDI PO) Take by mouth.    Historical Provider, MD  halobetasol (ULTRAVATE) 0.05 % cream Apply 1 application topically See admin instructions. He applies at bedtime as needed to scalp for psoriasis alternating with Dovonox Cream every 2 weeks.    Historical Provider, MD  leuprolide (LUPRON) 7.5 MG injection Inject 7.5 mg into the muscle as needed. He takes for his increased prostate specific antigen (PSA) levels.    Historical Provider, MD  losartan (COZAAR) 50 MG tablet Take 50 mg by mouth every morning.    Historical Provider, MD  ondansetron (ZOFRAN) 4 MG tablet Take 4 mg by mouth every 8 (eight) hours as needed for nausea  or vomiting.    Historical Provider, MD   Meds Ordered and Administered this Visit   Medications  rabies vaccine (RABAVERT) injection 1 mL (1 mL Intramuscular Given 05/25/15 1718)    BP 176/67 mmHg  Pulse 52  Temp(Src) 98.7 F (37.1 C) (Oral)  Resp 16  SpO2 97% No data found.   Physical Exam  Constitutional: He is oriented to person, place, and time. He appears well-developed and well-nourished.  Neurological: He is alert and oriented to person, place, and time.  Skin: Skin is warm and dry.  Nursing note and vitals reviewed.   ED Course  Procedures (including critical care  time)  Labs Review Labs Reviewed - No data to display  Imaging Review No results found.   Visual Acuity Review  Right Eye Distance:   Left Eye Distance:   Bilateral Distance:    Right Eye Near:   Left Eye Near:    Bilateral Near:         MDM  No diagnosis found.     [name], MD 06/03/15 2013

## 2015-05-25 NOTE — ED Notes (Signed)
Pt    Is  Here         For    Rabies         Injection           Pt  Was  Treated  For  An  Exposure    To  Bats            Pt  Verbalizes  No  Complaints        He  Was  Instructed       To  Receive  Pre  Exposure          Dr  Juventino Slovak  Advised   And  Will  See  The  Pt  And  Will   Advise      On the  Schedule

## 2015-05-25 NOTE — Discharge Instructions (Signed)
Return  On  Sept  11   For      Your  Next  Injection         Return  On  Sept     18        For  Subsequent          Check  With    Dr  Marcello Moores     To  See  If  You  Require     A  5  Th      Injection  Return  Sooner  If  Any  Problems

## 2015-05-29 ENCOUNTER — Encounter (HOSPITAL_COMMUNITY): Payer: Self-pay | Admitting: Emergency Medicine

## 2015-05-29 ENCOUNTER — Emergency Department (INDEPENDENT_AMBULATORY_CARE_PROVIDER_SITE_OTHER)
Admission: EM | Admit: 2015-05-29 | Discharge: 2015-05-29 | Disposition: A | Discharge status: Home / Self Care | Payer: Medicare Other | Source: Home / Self Care

## 2015-05-29 DIAGNOSIS — Z203 Contact with and (suspected) exposure to rabies: Secondary | ICD-10-CM | POA: Diagnosis not present

## 2015-05-29 DIAGNOSIS — Z23 Encounter for immunization: Secondary | ICD-10-CM

## 2015-05-29 MED ORDER — RABIES VACCINE, PCEC IM SUSR
1.0000 mL | Freq: Once | INTRAMUSCULAR | Status: AC
  Administered 2015-05-29: 1 mL via INTRAMUSCULAR

## 2015-05-29 MED ORDER — RABIES VACCINE, PCEC IM SUSR
INTRAMUSCULAR | Status: AC
  Filled 2015-05-29: qty 1

## 2015-05-29 NOTE — ED Notes (Signed)
Patient is here today for day 7 in rabies series.

## 2015-05-29 NOTE — ED Notes (Signed)
Patient reports speaking to his pcp and wife and patient reports there is no need for the 5th injection .  The last injection for this patient will be 9/18. No need to continue in series beyond day# 14.  Wife and patient aware there will be only one injection beyond today.

## 2015-05-31 DIAGNOSIS — L400 Psoriasis vulgaris: Secondary | ICD-10-CM | POA: Diagnosis not present

## 2015-06-05 ENCOUNTER — Emergency Department (HOSPITAL_COMMUNITY)
Admission: EM | Admit: 2015-06-05 | Discharge: 2015-06-05 | Disposition: A | Discharge status: Home / Self Care | Payer: Medicare Other | Source: Home / Self Care

## 2015-06-05 ENCOUNTER — Encounter (HOSPITAL_COMMUNITY): Payer: Self-pay | Admitting: Emergency Medicine

## 2015-06-05 MED ORDER — RABIES VACCINE, PCEC IM SUSR
1.0000 mL | Freq: Once | INTRAMUSCULAR | Status: AC
  Administered 2015-06-05: 1 mL via INTRAMUSCULAR

## 2015-06-05 MED ORDER — RABIES VACCINE, PCEC IM SUSR
INTRAMUSCULAR | Status: AC
  Filled 2015-06-05: qty 1

## 2015-06-05 NOTE — ED Notes (Signed)
Last injection  

## 2015-06-18 DIAGNOSIS — Z23 Encounter for immunization: Secondary | ICD-10-CM | POA: Diagnosis not present

## 2015-06-21 DIAGNOSIS — Z125 Encounter for screening for malignant neoplasm of prostate: Secondary | ICD-10-CM | POA: Diagnosis not present

## 2015-06-21 DIAGNOSIS — R5383 Other fatigue: Secondary | ICD-10-CM | POA: Diagnosis not present

## 2015-06-21 DIAGNOSIS — R32 Unspecified urinary incontinence: Secondary | ICD-10-CM | POA: Diagnosis not present

## 2015-06-21 DIAGNOSIS — Z9079 Acquired absence of other genital organ(s): Secondary | ICD-10-CM | POA: Diagnosis not present

## 2015-06-21 DIAGNOSIS — C649 Malignant neoplasm of unspecified kidney, except renal pelvis: Secondary | ICD-10-CM | POA: Diagnosis not present

## 2015-06-21 DIAGNOSIS — Z85528 Personal history of other malignant neoplasm of kidney: Secondary | ICD-10-CM | POA: Diagnosis not present

## 2015-06-21 DIAGNOSIS — I10 Essential (primary) hypertension: Secondary | ICD-10-CM | POA: Diagnosis not present

## 2015-06-21 DIAGNOSIS — C61 Malignant neoplasm of prostate: Secondary | ICD-10-CM | POA: Diagnosis not present

## 2015-06-21 DIAGNOSIS — Z905 Acquired absence of kidney: Secondary | ICD-10-CM | POA: Diagnosis not present

## 2015-06-21 DIAGNOSIS — Z79899 Other long term (current) drug therapy: Secondary | ICD-10-CM | POA: Diagnosis not present

## 2015-06-22 ENCOUNTER — Encounter: Payer: Self-pay | Admitting: Podiatry

## 2015-06-22 ENCOUNTER — Ambulatory Visit (INDEPENDENT_AMBULATORY_CARE_PROVIDER_SITE_OTHER): Payer: Medicare Other | Admitting: Podiatry

## 2015-06-22 VITALS — BP 142/77 | HR 54 | Resp 16

## 2015-06-22 DIAGNOSIS — M216X9 Other acquired deformities of unspecified foot: Secondary | ICD-10-CM

## 2015-06-22 DIAGNOSIS — L84 Corns and callosities: Secondary | ICD-10-CM | POA: Diagnosis not present

## 2015-06-22 NOTE — Progress Notes (Signed)
Subjective:     Patient ID: [name], male   DOB: 16-Jun-1933, 81 y.o.   MRN: [id number]  HPI patient presents with a painful lesion under the fourth metatarsal the right foot and states that he is having trouble walking significant distances on this and that it needs to be taken care of   Review of Systems     Objective:   Physical Exam Neurovascular status intact with significant plantarflexion fourth metatarsal right with keratotic lesion underneath this with a lucent-type core    Assessment:     Plantarflexed metatarsal fourth right with porokeratotic lesion secondary to bone pressure    Plan:     Reviewed consideration for orthotics or possible elevating osteotomy due to plantarflexed metatarsal and today did deep debridement of lesion which will be continued patient will reappoint when symptoms

## 2015-07-01 DIAGNOSIS — N3946 Mixed incontinence: Secondary | ICD-10-CM | POA: Diagnosis not present

## 2015-07-01 DIAGNOSIS — C61 Malignant neoplasm of prostate: Secondary | ICD-10-CM | POA: Diagnosis not present

## 2015-07-01 DIAGNOSIS — C649 Malignant neoplasm of unspecified kidney, except renal pelvis: Secondary | ICD-10-CM | POA: Diagnosis not present

## 2015-07-01 DIAGNOSIS — R972 Elevated prostate specific antigen [PSA]: Secondary | ICD-10-CM | POA: Diagnosis not present

## 2015-07-04 DIAGNOSIS — C61 Malignant neoplasm of prostate: Secondary | ICD-10-CM | POA: Diagnosis not present

## 2015-07-05 DIAGNOSIS — Z681 Body mass index (BMI) 19 or less, adult: Secondary | ICD-10-CM | POA: Diagnosis not present

## 2015-07-05 DIAGNOSIS — I10 Essential (primary) hypertension: Secondary | ICD-10-CM | POA: Diagnosis not present

## 2015-07-05 DIAGNOSIS — S40922A Unspecified superficial injury of left upper arm, initial encounter: Secondary | ICD-10-CM | POA: Diagnosis not present

## 2015-07-07 DIAGNOSIS — S40922D Unspecified superficial injury of left upper arm, subsequent encounter: Secondary | ICD-10-CM | POA: Diagnosis not present

## 2015-07-07 DIAGNOSIS — Z681 Body mass index (BMI) 19 or less, adult: Secondary | ICD-10-CM | POA: Diagnosis not present

## 2015-07-07 DIAGNOSIS — I10 Essential (primary) hypertension: Secondary | ICD-10-CM | POA: Diagnosis not present

## 2015-08-23 DIAGNOSIS — C61 Malignant neoplasm of prostate: Secondary | ICD-10-CM | POA: Diagnosis not present

## 2015-08-23 DIAGNOSIS — I10 Essential (primary) hypertension: Secondary | ICD-10-CM | POA: Diagnosis not present

## 2015-08-30 DIAGNOSIS — H2511 Age-related nuclear cataract, right eye: Secondary | ICD-10-CM | POA: Diagnosis not present

## 2015-08-30 DIAGNOSIS — H353113 Nonexudative age-related macular degeneration, right eye, advanced atrophic without subfoveal involvement: Secondary | ICD-10-CM | POA: Diagnosis not present

## 2015-08-30 DIAGNOSIS — H353123 Nonexudative age-related macular degeneration, left eye, advanced atrophic without subfoveal involvement: Secondary | ICD-10-CM | POA: Diagnosis not present

## 2015-09-06 DIAGNOSIS — Z888 Allergy status to other drugs, medicaments and biological substances status: Secondary | ICD-10-CM | POA: Diagnosis not present

## 2015-09-06 DIAGNOSIS — C7951 Secondary malignant neoplasm of bone: Secondary | ICD-10-CM | POA: Diagnosis not present

## 2015-09-06 DIAGNOSIS — N281 Cyst of kidney, acquired: Secondary | ICD-10-CM | POA: Diagnosis not present

## 2015-09-06 DIAGNOSIS — C61 Malignant neoplasm of prostate: Secondary | ICD-10-CM | POA: Diagnosis not present

## 2015-09-15 ENCOUNTER — Ambulatory Visit (INDEPENDENT_AMBULATORY_CARE_PROVIDER_SITE_OTHER): Payer: Medicare Other | Admitting: Podiatry

## 2015-09-15 ENCOUNTER — Encounter: Payer: Self-pay | Admitting: Podiatry

## 2015-09-15 ENCOUNTER — Ambulatory Visit: Payer: Medicare Other | Admitting: Podiatry

## 2015-09-15 VITALS — BP 139/75 | HR 51 | Resp 16

## 2015-09-15 DIAGNOSIS — L84 Corns and callosities: Secondary | ICD-10-CM

## 2015-09-15 NOTE — Progress Notes (Signed)
Subjective:     Patient ID: [name], male   DOB: 10/07/1932, 81 y.o.   MRN: [id number]  HPI patient presents I have had these 2 calluses on my right plantar foot that have been sore   Review of Systems     Objective:   Physical Exam Neurovascular status intact with keratotic lesion plantar right    Assessment:     Lesion secondary to pressure    Plan:     Debride 2 lesions with no iatrogenic bleeding noted

## 2015-09-28 DIAGNOSIS — C61 Malignant neoplasm of prostate: Secondary | ICD-10-CM | POA: Diagnosis not present

## 2015-09-28 DIAGNOSIS — Z905 Acquired absence of kidney: Secondary | ICD-10-CM | POA: Diagnosis not present

## 2015-09-28 DIAGNOSIS — N281 Cyst of kidney, acquired: Secondary | ICD-10-CM | POA: Diagnosis not present

## 2015-10-04 DIAGNOSIS — G959 Disease of spinal cord, unspecified: Secondary | ICD-10-CM | POA: Diagnosis not present

## 2015-10-04 DIAGNOSIS — Z8546 Personal history of malignant neoplasm of prostate: Secondary | ICD-10-CM | POA: Diagnosis not present

## 2015-10-04 DIAGNOSIS — Z85528 Personal history of other malignant neoplasm of kidney: Secondary | ICD-10-CM | POA: Diagnosis not present

## 2015-10-04 DIAGNOSIS — N289 Disorder of kidney and ureter, unspecified: Secondary | ICD-10-CM | POA: Diagnosis not present

## 2015-10-04 DIAGNOSIS — C61 Malignant neoplasm of prostate: Secondary | ICD-10-CM | POA: Diagnosis not present

## 2015-10-31 DIAGNOSIS — N183 Chronic kidney disease, stage 3 (moderate): Secondary | ICD-10-CM | POA: Diagnosis not present

## 2015-10-31 DIAGNOSIS — E784 Other hyperlipidemia: Secondary | ICD-10-CM | POA: Diagnosis not present

## 2015-10-31 DIAGNOSIS — Z125 Encounter for screening for malignant neoplasm of prostate: Secondary | ICD-10-CM | POA: Diagnosis not present

## 2015-10-31 DIAGNOSIS — M859 Disorder of bone density and structure, unspecified: Secondary | ICD-10-CM | POA: Diagnosis not present

## 2015-11-07 DIAGNOSIS — M859 Disorder of bone density and structure, unspecified: Secondary | ICD-10-CM | POA: Diagnosis not present

## 2015-11-07 DIAGNOSIS — C61 Malignant neoplasm of prostate: Secondary | ICD-10-CM | POA: Diagnosis not present

## 2015-11-07 DIAGNOSIS — Z23 Encounter for immunization: Secondary | ICD-10-CM | POA: Diagnosis not present

## 2015-11-07 DIAGNOSIS — E784 Other hyperlipidemia: Secondary | ICD-10-CM | POA: Diagnosis not present

## 2015-11-07 DIAGNOSIS — Z681 Body mass index (BMI) 19 or less, adult: Secondary | ICD-10-CM | POA: Diagnosis not present

## 2015-11-07 DIAGNOSIS — C649 Malignant neoplasm of unspecified kidney, except renal pelvis: Secondary | ICD-10-CM | POA: Diagnosis not present

## 2015-11-07 DIAGNOSIS — Z1389 Encounter for screening for other disorder: Secondary | ICD-10-CM | POA: Diagnosis not present

## 2015-11-07 DIAGNOSIS — Z0000 Encounter for general adult medical examination without abnormal findings: Secondary | ICD-10-CM | POA: Diagnosis not present

## 2015-11-07 DIAGNOSIS — N183 Chronic kidney disease, stage 3 (moderate): Secondary | ICD-10-CM | POA: Diagnosis not present

## 2015-11-07 DIAGNOSIS — I10 Essential (primary) hypertension: Secondary | ICD-10-CM | POA: Diagnosis not present

## 2015-11-07 DIAGNOSIS — R5383 Other fatigue: Secondary | ICD-10-CM | POA: Diagnosis not present

## 2015-11-28 DIAGNOSIS — L400 Psoriasis vulgaris: Secondary | ICD-10-CM | POA: Diagnosis not present

## 2015-11-29 DIAGNOSIS — Z79899 Other long term (current) drug therapy: Secondary | ICD-10-CM | POA: Diagnosis not present

## 2015-11-29 DIAGNOSIS — Z5111 Encounter for antineoplastic chemotherapy: Secondary | ICD-10-CM | POA: Diagnosis not present

## 2015-11-29 DIAGNOSIS — Z85528 Personal history of other malignant neoplasm of kidney: Secondary | ICD-10-CM | POA: Diagnosis not present

## 2015-11-29 DIAGNOSIS — N281 Cyst of kidney, acquired: Secondary | ICD-10-CM | POA: Diagnosis not present

## 2015-11-29 DIAGNOSIS — Z5181 Encounter for therapeutic drug level monitoring: Secondary | ICD-10-CM | POA: Diagnosis not present

## 2015-11-29 DIAGNOSIS — C61 Malignant neoplasm of prostate: Secondary | ICD-10-CM | POA: Diagnosis not present

## 2015-11-29 DIAGNOSIS — Z905 Acquired absence of kidney: Secondary | ICD-10-CM | POA: Diagnosis not present

## 2015-12-23 DIAGNOSIS — R972 Elevated prostate specific antigen [PSA]: Secondary | ICD-10-CM | POA: Diagnosis not present

## 2015-12-23 DIAGNOSIS — C61 Malignant neoplasm of prostate: Secondary | ICD-10-CM | POA: Diagnosis not present

## 2015-12-27 DIAGNOSIS — Z882 Allergy status to sulfonamides status: Secondary | ICD-10-CM | POA: Diagnosis not present

## 2015-12-27 DIAGNOSIS — Z888 Allergy status to other drugs, medicaments and biological substances status: Secondary | ICD-10-CM | POA: Diagnosis not present

## 2015-12-27 DIAGNOSIS — Z905 Acquired absence of kidney: Secondary | ICD-10-CM | POA: Diagnosis not present

## 2015-12-27 DIAGNOSIS — C61 Malignant neoplasm of prostate: Secondary | ICD-10-CM | POA: Diagnosis not present

## 2015-12-27 DIAGNOSIS — Z9079 Acquired absence of other genital organ(s): Secondary | ICD-10-CM | POA: Diagnosis not present

## 2016-01-09 DIAGNOSIS — C61 Malignant neoplasm of prostate: Secondary | ICD-10-CM | POA: Diagnosis not present

## 2016-02-15 ENCOUNTER — Ambulatory Visit (INDEPENDENT_AMBULATORY_CARE_PROVIDER_SITE_OTHER): Payer: Medicare Other | Admitting: Podiatry

## 2016-02-15 DIAGNOSIS — L84 Corns and callosities: Secondary | ICD-10-CM | POA: Diagnosis not present

## 2016-02-15 NOTE — Progress Notes (Signed)
Subjective:     Patient ID: [name], male   DOB: 09-14-1933, 82 y.o.   MRN: [id number]  HPI patient presents with painful lesions plantar aspect right foot   Review of Systems     Objective:   Physical Exam Neurovascular status intact with keratotic lesion sub-second fifth metatarsal right that are painful    Assessment:     Lesion secondary to pressure    Plan:     Debridement lesions on the right foot with no iatrogenic bleeding and reappoint to recheck

## 2016-02-28 DIAGNOSIS — H353122 Nonexudative age-related macular degeneration, left eye, intermediate dry stage: Secondary | ICD-10-CM | POA: Diagnosis not present

## 2016-02-28 DIAGNOSIS — H353112 Nonexudative age-related macular degeneration, right eye, intermediate dry stage: Secondary | ICD-10-CM | POA: Diagnosis not present

## 2016-02-28 DIAGNOSIS — H524 Presbyopia: Secondary | ICD-10-CM | POA: Diagnosis not present

## 2016-02-28 DIAGNOSIS — H2511 Age-related nuclear cataract, right eye: Secondary | ICD-10-CM | POA: Diagnosis not present

## 2016-03-01 DIAGNOSIS — Z5181 Encounter for therapeutic drug level monitoring: Secondary | ICD-10-CM | POA: Diagnosis not present

## 2016-03-01 DIAGNOSIS — Z79899 Other long term (current) drug therapy: Secondary | ICD-10-CM | POA: Diagnosis not present

## 2016-03-01 DIAGNOSIS — C7951 Secondary malignant neoplasm of bone: Secondary | ICD-10-CM | POA: Diagnosis not present

## 2016-03-01 DIAGNOSIS — Z905 Acquired absence of kidney: Secondary | ICD-10-CM | POA: Diagnosis not present

## 2016-03-01 DIAGNOSIS — C61 Malignant neoplasm of prostate: Secondary | ICD-10-CM | POA: Diagnosis not present

## 2016-03-01 DIAGNOSIS — Z85528 Personal history of other malignant neoplasm of kidney: Secondary | ICD-10-CM | POA: Diagnosis not present

## 2016-03-01 DIAGNOSIS — Z5111 Encounter for antineoplastic chemotherapy: Secondary | ICD-10-CM | POA: Diagnosis not present

## 2016-03-16 ENCOUNTER — Other Ambulatory Visit: Payer: Self-pay | Admitting: Gastroenterology

## 2016-03-29 DIAGNOSIS — Z8553 Personal history of malignant neoplasm of renal pelvis: Secondary | ICD-10-CM | POA: Diagnosis not present

## 2016-03-29 DIAGNOSIS — Z8546 Personal history of malignant neoplasm of prostate: Secondary | ICD-10-CM | POA: Diagnosis not present

## 2016-03-29 DIAGNOSIS — Z905 Acquired absence of kidney: Secondary | ICD-10-CM | POA: Diagnosis not present

## 2016-03-29 DIAGNOSIS — C641 Malignant neoplasm of right kidney, except renal pelvis: Secondary | ICD-10-CM | POA: Diagnosis not present

## 2016-03-29 DIAGNOSIS — C7951 Secondary malignant neoplasm of bone: Secondary | ICD-10-CM | POA: Diagnosis not present

## 2016-03-29 DIAGNOSIS — C61 Malignant neoplasm of prostate: Secondary | ICD-10-CM | POA: Diagnosis not present

## 2016-05-01 ENCOUNTER — Ambulatory Visit (HOSPITAL_COMMUNITY)
Admission: RE | Admit: 2016-05-01 | Discharge: 2016-05-01 | Disposition: A | Discharge status: Home / Self Care | Payer: Medicare Other | Source: Ambulatory Visit | Attending: Gastroenterology | Admitting: Gastroenterology

## 2016-05-01 ENCOUNTER — Encounter (HOSPITAL_COMMUNITY): Payer: Self-pay

## 2016-05-01 ENCOUNTER — Encounter (HOSPITAL_COMMUNITY)
Admission: RE | Disposition: A | Discharge status: Home / Self Care | Payer: Self-pay | Source: Ambulatory Visit | Attending: Gastroenterology

## 2016-05-01 DIAGNOSIS — Z9079 Acquired absence of other genital organ(s): Secondary | ICD-10-CM | POA: Diagnosis not present

## 2016-05-01 DIAGNOSIS — Z905 Acquired absence of kidney: Secondary | ICD-10-CM | POA: Diagnosis not present

## 2016-05-01 DIAGNOSIS — Z86010 Personal history of colonic polyps: Secondary | ICD-10-CM | POA: Diagnosis not present

## 2016-05-01 DIAGNOSIS — Z8546 Personal history of malignant neoplasm of prostate: Secondary | ICD-10-CM | POA: Insufficient documentation

## 2016-05-01 DIAGNOSIS — E7800 Pure hypercholesterolemia, unspecified: Secondary | ICD-10-CM | POA: Insufficient documentation

## 2016-05-01 DIAGNOSIS — Z7982 Long term (current) use of aspirin: Secondary | ICD-10-CM | POA: Diagnosis not present

## 2016-05-01 DIAGNOSIS — Z85528 Personal history of other malignant neoplasm of kidney: Secondary | ICD-10-CM | POA: Diagnosis not present

## 2016-05-01 DIAGNOSIS — L409 Psoriasis, unspecified: Secondary | ICD-10-CM | POA: Insufficient documentation

## 2016-05-01 DIAGNOSIS — Z7952 Long term (current) use of systemic steroids: Secondary | ICD-10-CM | POA: Insufficient documentation

## 2016-05-01 DIAGNOSIS — I10 Essential (primary) hypertension: Secondary | ICD-10-CM | POA: Diagnosis not present

## 2016-05-01 DIAGNOSIS — Z1211 Encounter for screening for malignant neoplasm of colon: Secondary | ICD-10-CM | POA: Diagnosis not present

## 2016-05-01 HISTORY — PX: FLEXIBLE SIGMOIDOSCOPY: SHX5431

## 2016-05-01 HISTORY — DX: Other specified postprocedural states: Z98.890

## 2016-05-01 HISTORY — DX: Other specified postprocedural states: R11.2

## 2016-05-01 SURGERY — SIGMOIDOSCOPY, FLEXIBLE
Anesthesia: Moderate Sedation

## 2016-05-01 MED ORDER — SODIUM CHLORIDE 0.9 % IV SOLN: INTRAVENOUS | Status: DC

## 2016-05-01 NOTE — H&P (Signed)
  Procedure: Surveillance flexible proctosigmoidoscopy. 10/01/2012 colonoscopy was performed with removal of a 3 cm sigmoid colon pedunculated tubulovillous adenomatous polyp. The stalk did not contain adenomatous tissue.  History: The patient is an 82 year old male born [birth year]. He is scheduled to undergo a surveillance flexible proctosigmoidoscopy to evaluate the sigmoid colon polypectomy site.  Medication allergies: ACE inhibitors  Past medical history: Tonsillectomy. Right nephrectomy. Multiple hernia surgeries. Prostatectomy. Renal cell cancer of the right kidney. Hypertension. Osteopenia. Prostate cancer. Psoriasis. Hypercholesterolemia.  Exam: The patient is alert and lying comfortably on the endoscopy stretcher. Abdomen is soft and nontender to palpation. Lungs are clear to auscultation. Cardiac exam reveals a regular rhythm.  Plan: Proceed with surveillance flexible proctosigmoidoscopy

## 2016-05-01 NOTE — Progress Notes (Signed)
Unsedated flexible sigmoidoscopy complete.  Discharge instructions given to pt and wife.  Pt discharged home with wife.  Vista Lawman, RN

## 2016-05-01 NOTE — Op Note (Signed)
[facility] Patient Name: [name] Procedure Date: 05/01/2016 MRN: [id number] Attending MD: Garlan Fair , MD Date of Birth: September 20, 1932 [name]: [id number] Age: 82 Admit Type: Outpatient Procedure:                Flexible Sigmoidoscopy Indications:              High risk colon cancer surveillance: Personal                            history of adenoma (10 mm or greater in size) Providers:                Garlan Fair, MD, Dustin Flock RN, RN, Cherylynn Ridges, Technician Referring MD:              Medicines:                None Complications:            No immediate complications. Estimated Blood Loss:     Estimated blood loss: none. Procedure:                Pre-Anesthesia Assessment:                           - Prior to the procedure, a History and Physical                            was performed, and patient medications and                            allergies were reviewed. The patient's tolerance of                            previous anesthesia was also reviewed. The risks                            and benefits of the procedure and the sedation                            options and risks were discussed with the patient.                            All questions were answered, and informed consent                            was obtained. Prior Anticoagulants: The patient has                            taken aspirin, last dose was 1 day prior to                            procedure. ASA Grade Assessment: II - A patient  with mild systemic disease. After reviewing the                            risks and benefits, the patient was deemed in                            satisfactory condition to undergo the procedure.                           After obtaining informed consent, the scope was                            passed under direct vision. The [id number]                            scope was  introduced through the anus and advanced                            to the the sigmoid colon. The flexible                            sigmoidoscopy was accomplished without difficulty.                            The patient tolerated the procedure well. The                            quality of the bowel preparation was good. Scope In: 11:39:30 AM Scope Out: 11:43:23 AM Total Procedure Duration: 0 hours 3 minutes 53 seconds  Findings:      The perianal and digital rectal examinations were normal.      The entire examined colon appeared normal. Impression:               - The entire examined colon is normal.                           - No specimens collected. Moderate Sedation:      None Recommendation:           - Discharge patient to home (ambulatory). Procedure Code(s):        --- Professional ---                           [id number], Colorectal cancer screening; flexible                            sigmoidoscopy Diagnosis Code(s):        --- Professional ---                           Z86.010, Personal history of colonic polyps CPT copyright 2016 [employer]. All rights reserved. The codes documented in this report are preliminary and upon coder review may  be revised to meet current compliance requirements. Earle Gell, MD Garlan Fair, MD 05/01/2016 11:49:56 AM This report has been signed electronically. Number of Addenda: 0

## 2016-05-03 ENCOUNTER — Encounter (HOSPITAL_COMMUNITY): Payer: Self-pay | Admitting: Gastroenterology

## 2016-05-08 DIAGNOSIS — Z888 Allergy status to other drugs, medicaments and biological substances status: Secondary | ICD-10-CM | POA: Diagnosis not present

## 2016-05-08 DIAGNOSIS — C7951 Secondary malignant neoplasm of bone: Secondary | ICD-10-CM | POA: Diagnosis not present

## 2016-05-08 DIAGNOSIS — C61 Malignant neoplasm of prostate: Secondary | ICD-10-CM | POA: Diagnosis not present

## 2016-05-08 DIAGNOSIS — N289 Disorder of kidney and ureter, unspecified: Secondary | ICD-10-CM | POA: Diagnosis not present

## 2016-05-08 DIAGNOSIS — Z9079 Acquired absence of other genital organ(s): Secondary | ICD-10-CM | POA: Diagnosis not present

## 2016-05-16 DIAGNOSIS — N183 Chronic kidney disease, stage 3 (moderate): Secondary | ICD-10-CM | POA: Diagnosis not present

## 2016-05-16 DIAGNOSIS — D631 Anemia in chronic kidney disease: Secondary | ICD-10-CM | POA: Diagnosis not present

## 2016-05-16 DIAGNOSIS — N2581 Secondary hyperparathyroidism of renal origin: Secondary | ICD-10-CM | POA: Diagnosis not present

## 2016-05-28 DIAGNOSIS — L400 Psoriasis vulgaris: Secondary | ICD-10-CM | POA: Diagnosis not present

## 2016-05-28 DIAGNOSIS — L821 Other seborrheic keratosis: Secondary | ICD-10-CM | POA: Diagnosis not present

## 2016-05-28 DIAGNOSIS — D2222 Melanocytic nevi of left ear and external auricular canal: Secondary | ICD-10-CM | POA: Diagnosis not present

## 2016-05-28 DIAGNOSIS — D485 Neoplasm of uncertain behavior of skin: Secondary | ICD-10-CM | POA: Diagnosis not present

## 2016-05-28 DIAGNOSIS — D692 Other nonthrombocytopenic purpura: Secondary | ICD-10-CM | POA: Diagnosis not present

## 2016-06-07 DIAGNOSIS — Z905 Acquired absence of kidney: Secondary | ICD-10-CM | POA: Diagnosis not present

## 2016-06-07 DIAGNOSIS — Z7982 Long term (current) use of aspirin: Secondary | ICD-10-CM | POA: Diagnosis not present

## 2016-06-07 DIAGNOSIS — C641 Malignant neoplasm of right kidney, except renal pelvis: Secondary | ICD-10-CM | POA: Diagnosis not present

## 2016-06-07 DIAGNOSIS — Z79899 Other long term (current) drug therapy: Secondary | ICD-10-CM | POA: Diagnosis not present

## 2016-06-07 DIAGNOSIS — C61 Malignant neoplasm of prostate: Secondary | ICD-10-CM | POA: Diagnosis not present

## 2016-06-07 DIAGNOSIS — Z888 Allergy status to other drugs, medicaments and biological substances status: Secondary | ICD-10-CM | POA: Diagnosis not present

## 2016-06-07 DIAGNOSIS — Z882 Allergy status to sulfonamides status: Secondary | ICD-10-CM | POA: Diagnosis not present

## 2016-06-07 DIAGNOSIS — Z85528 Personal history of other malignant neoplasm of kidney: Secondary | ICD-10-CM | POA: Diagnosis not present

## 2016-06-07 DIAGNOSIS — C7951 Secondary malignant neoplasm of bone: Secondary | ICD-10-CM | POA: Diagnosis not present

## 2016-06-07 DIAGNOSIS — Z9079 Acquired absence of other genital organ(s): Secondary | ICD-10-CM | POA: Diagnosis not present

## 2016-06-07 DIAGNOSIS — N289 Disorder of kidney and ureter, unspecified: Secondary | ICD-10-CM | POA: Diagnosis not present

## 2016-06-07 DIAGNOSIS — N183 Chronic kidney disease, stage 3 (moderate): Secondary | ICD-10-CM | POA: Diagnosis not present

## 2016-06-12 DIAGNOSIS — Z8546 Personal history of malignant neoplasm of prostate: Secondary | ICD-10-CM | POA: Diagnosis not present

## 2016-06-12 DIAGNOSIS — C641 Malignant neoplasm of right kidney, except renal pelvis: Secondary | ICD-10-CM | POA: Diagnosis not present

## 2016-06-12 DIAGNOSIS — N281 Cyst of kidney, acquired: Secondary | ICD-10-CM | POA: Diagnosis not present

## 2016-06-12 DIAGNOSIS — C7951 Secondary malignant neoplasm of bone: Secondary | ICD-10-CM | POA: Diagnosis not present

## 2016-06-12 DIAGNOSIS — C61 Malignant neoplasm of prostate: Secondary | ICD-10-CM | POA: Diagnosis not present

## 2016-06-12 DIAGNOSIS — Z8553 Personal history of malignant neoplasm of renal pelvis: Secondary | ICD-10-CM | POA: Diagnosis not present

## 2016-06-12 DIAGNOSIS — N183 Chronic kidney disease, stage 3 (moderate): Secondary | ICD-10-CM | POA: Diagnosis not present

## 2016-06-16 DIAGNOSIS — Z23 Encounter for immunization: Secondary | ICD-10-CM | POA: Diagnosis not present

## 2016-06-26 DIAGNOSIS — L988 Other specified disorders of the skin and subcutaneous tissue: Secondary | ICD-10-CM | POA: Diagnosis not present

## 2016-06-26 DIAGNOSIS — D485 Neoplasm of uncertain behavior of skin: Secondary | ICD-10-CM | POA: Diagnosis not present

## 2016-07-16 DIAGNOSIS — H00011 Hordeolum externum right upper eyelid: Secondary | ICD-10-CM | POA: Diagnosis not present

## 2016-07-17 DIAGNOSIS — Z5111 Encounter for antineoplastic chemotherapy: Secondary | ICD-10-CM | POA: Diagnosis not present

## 2016-07-17 DIAGNOSIS — Z85528 Personal history of other malignant neoplasm of kidney: Secondary | ICD-10-CM | POA: Diagnosis not present

## 2016-07-17 DIAGNOSIS — Z7982 Long term (current) use of aspirin: Secondary | ICD-10-CM | POA: Diagnosis not present

## 2016-07-17 DIAGNOSIS — C61 Malignant neoplasm of prostate: Secondary | ICD-10-CM | POA: Diagnosis not present

## 2016-07-17 DIAGNOSIS — Z5181 Encounter for therapeutic drug level monitoring: Secondary | ICD-10-CM | POA: Diagnosis not present

## 2016-07-17 DIAGNOSIS — Z79899 Other long term (current) drug therapy: Secondary | ICD-10-CM | POA: Diagnosis not present

## 2016-07-17 DIAGNOSIS — Z905 Acquired absence of kidney: Secondary | ICD-10-CM | POA: Diagnosis not present

## 2016-08-06 DIAGNOSIS — C649 Malignant neoplasm of unspecified kidney, except renal pelvis: Secondary | ICD-10-CM | POA: Diagnosis not present

## 2016-08-06 DIAGNOSIS — C61 Malignant neoplasm of prostate: Secondary | ICD-10-CM | POA: Diagnosis not present

## 2016-08-28 DIAGNOSIS — Z5111 Encounter for antineoplastic chemotherapy: Secondary | ICD-10-CM | POA: Diagnosis not present

## 2016-08-28 DIAGNOSIS — Z905 Acquired absence of kidney: Secondary | ICD-10-CM | POA: Diagnosis not present

## 2016-08-28 DIAGNOSIS — N289 Disorder of kidney and ureter, unspecified: Secondary | ICD-10-CM | POA: Diagnosis not present

## 2016-08-28 DIAGNOSIS — Z8546 Personal history of malignant neoplasm of prostate: Secondary | ICD-10-CM | POA: Diagnosis not present

## 2016-08-28 DIAGNOSIS — Z79899 Other long term (current) drug therapy: Secondary | ICD-10-CM | POA: Diagnosis not present

## 2016-08-28 DIAGNOSIS — C7951 Secondary malignant neoplasm of bone: Secondary | ICD-10-CM | POA: Diagnosis not present

## 2016-08-28 DIAGNOSIS — Z7982 Long term (current) use of aspirin: Secondary | ICD-10-CM | POA: Diagnosis not present

## 2016-08-28 DIAGNOSIS — Z5181 Encounter for therapeutic drug level monitoring: Secondary | ICD-10-CM | POA: Diagnosis not present

## 2016-08-28 DIAGNOSIS — C641 Malignant neoplasm of right kidney, except renal pelvis: Secondary | ICD-10-CM | POA: Diagnosis not present

## 2016-08-28 DIAGNOSIS — C61 Malignant neoplasm of prostate: Secondary | ICD-10-CM | POA: Diagnosis not present

## 2016-09-12 DIAGNOSIS — S41112A Laceration without foreign body of left upper arm, initial encounter: Secondary | ICD-10-CM | POA: Diagnosis not present

## 2016-09-12 DIAGNOSIS — Z6820 Body mass index (BMI) 20.0-20.9, adult: Secondary | ICD-10-CM | POA: Diagnosis not present

## 2016-09-18 DIAGNOSIS — H353122 Nonexudative age-related macular degeneration, left eye, intermediate dry stage: Secondary | ICD-10-CM | POA: Diagnosis not present

## 2016-09-18 DIAGNOSIS — H35372 Puckering of macula, left eye: Secondary | ICD-10-CM | POA: Diagnosis not present

## 2016-09-18 DIAGNOSIS — H35371 Puckering of macula, right eye: Secondary | ICD-10-CM | POA: Diagnosis not present

## 2016-09-18 DIAGNOSIS — H353112 Nonexudative age-related macular degeneration, right eye, intermediate dry stage: Secondary | ICD-10-CM | POA: Diagnosis not present

## 2016-09-27 ENCOUNTER — Ambulatory Visit (INDEPENDENT_AMBULATORY_CARE_PROVIDER_SITE_OTHER): Payer: Medicare Other | Admitting: Podiatry

## 2016-09-27 ENCOUNTER — Encounter: Payer: Self-pay | Admitting: Podiatry

## 2016-09-27 DIAGNOSIS — L84 Corns and callosities: Secondary | ICD-10-CM

## 2016-09-30 NOTE — Progress Notes (Signed)
Subjective:     Patient ID: [name], male   DOB: [birth year], 83 y.o.   MRN: [id number]  HPI patient presents with callus formation that's painful left and right foot with lesion formation   Review of Systems     Objective:   Physical Exam Neurovascular status intact with lesions plantar left fourth toe left that are painful when pressed    Assessment:     Keratotic lesion left and right foot with pain    Plan:     Debride lesions with no iatrogenic bleeding noted

## 2016-10-09 DIAGNOSIS — Z5111 Encounter for antineoplastic chemotherapy: Secondary | ICD-10-CM | POA: Diagnosis not present

## 2016-10-09 DIAGNOSIS — C61 Malignant neoplasm of prostate: Secondary | ICD-10-CM | POA: Diagnosis not present

## 2016-10-09 DIAGNOSIS — Z5181 Encounter for therapeutic drug level monitoring: Secondary | ICD-10-CM | POA: Diagnosis not present

## 2016-10-09 DIAGNOSIS — C7951 Secondary malignant neoplasm of bone: Secondary | ICD-10-CM | POA: Diagnosis not present

## 2016-10-09 DIAGNOSIS — Z79899 Other long term (current) drug therapy: Secondary | ICD-10-CM | POA: Diagnosis not present

## 2016-11-14 DIAGNOSIS — M859 Disorder of bone density and structure, unspecified: Secondary | ICD-10-CM | POA: Diagnosis not present

## 2016-11-14 DIAGNOSIS — E784 Other hyperlipidemia: Secondary | ICD-10-CM | POA: Diagnosis not present

## 2016-11-14 DIAGNOSIS — N183 Chronic kidney disease, stage 3 (moderate): Secondary | ICD-10-CM | POA: Diagnosis not present

## 2016-11-14 DIAGNOSIS — C61 Malignant neoplasm of prostate: Secondary | ICD-10-CM | POA: Diagnosis not present

## 2016-11-15 DIAGNOSIS — N390 Urinary tract infection, site not specified: Secondary | ICD-10-CM | POA: Diagnosis not present

## 2016-11-15 DIAGNOSIS — R8299 Other abnormal findings in urine: Secondary | ICD-10-CM | POA: Diagnosis not present

## 2016-11-19 DIAGNOSIS — N390 Urinary tract infection, site not specified: Secondary | ICD-10-CM | POA: Diagnosis not present

## 2016-11-19 DIAGNOSIS — E784 Other hyperlipidemia: Secondary | ICD-10-CM | POA: Diagnosis not present

## 2016-11-19 DIAGNOSIS — N183 Chronic kidney disease, stage 3 (moderate): Secondary | ICD-10-CM | POA: Diagnosis not present

## 2016-11-19 DIAGNOSIS — Z6820 Body mass index (BMI) 20.0-20.9, adult: Secondary | ICD-10-CM | POA: Diagnosis not present

## 2016-11-19 DIAGNOSIS — C61 Malignant neoplasm of prostate: Secondary | ICD-10-CM | POA: Diagnosis not present

## 2016-11-19 DIAGNOSIS — Z1389 Encounter for screening for other disorder: Secondary | ICD-10-CM | POA: Diagnosis not present

## 2016-11-19 DIAGNOSIS — R5383 Other fatigue: Secondary | ICD-10-CM | POA: Diagnosis not present

## 2016-11-19 DIAGNOSIS — I10 Essential (primary) hypertension: Secondary | ICD-10-CM | POA: Diagnosis not present

## 2016-11-19 DIAGNOSIS — Z0000 Encounter for general adult medical examination without abnormal findings: Secondary | ICD-10-CM | POA: Diagnosis not present

## 2016-11-19 DIAGNOSIS — M859 Disorder of bone density and structure, unspecified: Secondary | ICD-10-CM | POA: Diagnosis not present

## 2016-11-19 DIAGNOSIS — E46 Unspecified protein-calorie malnutrition: Secondary | ICD-10-CM | POA: Diagnosis not present

## 2016-11-20 DIAGNOSIS — C61 Malignant neoplasm of prostate: Secondary | ICD-10-CM | POA: Diagnosis not present

## 2016-11-20 DIAGNOSIS — Z5181 Encounter for therapeutic drug level monitoring: Secondary | ICD-10-CM | POA: Diagnosis not present

## 2016-11-20 DIAGNOSIS — Z79899 Other long term (current) drug therapy: Secondary | ICD-10-CM | POA: Diagnosis not present

## 2016-11-20 DIAGNOSIS — Z5111 Encounter for antineoplastic chemotherapy: Secondary | ICD-10-CM | POA: Diagnosis not present

## 2016-11-20 DIAGNOSIS — C641 Malignant neoplasm of right kidney, except renal pelvis: Secondary | ICD-10-CM | POA: Diagnosis not present

## 2016-11-20 DIAGNOSIS — Z905 Acquired absence of kidney: Secondary | ICD-10-CM | POA: Diagnosis not present

## 2016-11-20 DIAGNOSIS — C7951 Secondary malignant neoplasm of bone: Secondary | ICD-10-CM | POA: Diagnosis not present

## 2016-11-27 DIAGNOSIS — C61 Malignant neoplasm of prostate: Secondary | ICD-10-CM | POA: Diagnosis not present

## 2016-12-03 DIAGNOSIS — D225 Melanocytic nevi of trunk: Secondary | ICD-10-CM | POA: Diagnosis not present

## 2016-12-03 DIAGNOSIS — L400 Psoriasis vulgaris: Secondary | ICD-10-CM | POA: Diagnosis not present

## 2016-12-03 DIAGNOSIS — D1801 Hemangioma of skin and subcutaneous tissue: Secondary | ICD-10-CM | POA: Diagnosis not present

## 2016-12-03 DIAGNOSIS — L821 Other seborrheic keratosis: Secondary | ICD-10-CM | POA: Diagnosis not present

## 2016-12-03 DIAGNOSIS — D2239 Melanocytic nevi of other parts of face: Secondary | ICD-10-CM | POA: Diagnosis not present

## 2016-12-04 DIAGNOSIS — C61 Malignant neoplasm of prostate: Secondary | ICD-10-CM | POA: Diagnosis not present

## 2016-12-11 DIAGNOSIS — Z5111 Encounter for antineoplastic chemotherapy: Secondary | ICD-10-CM | POA: Diagnosis not present

## 2016-12-11 DIAGNOSIS — N289 Disorder of kidney and ureter, unspecified: Secondary | ICD-10-CM | POA: Diagnosis not present

## 2016-12-11 DIAGNOSIS — C61 Malignant neoplasm of prostate: Secondary | ICD-10-CM | POA: Diagnosis not present

## 2016-12-11 DIAGNOSIS — Z79899 Other long term (current) drug therapy: Secondary | ICD-10-CM | POA: Diagnosis not present

## 2016-12-11 DIAGNOSIS — Z5181 Encounter for therapeutic drug level monitoring: Secondary | ICD-10-CM | POA: Diagnosis not present

## 2016-12-11 DIAGNOSIS — C7951 Secondary malignant neoplasm of bone: Secondary | ICD-10-CM | POA: Diagnosis not present

## 2016-12-18 DIAGNOSIS — C61 Malignant neoplasm of prostate: Secondary | ICD-10-CM | POA: Diagnosis not present

## 2016-12-25 DIAGNOSIS — C61 Malignant neoplasm of prostate: Secondary | ICD-10-CM | POA: Diagnosis not present

## 2017-01-01 DIAGNOSIS — C61 Malignant neoplasm of prostate: Secondary | ICD-10-CM | POA: Diagnosis not present

## 2017-01-01 DIAGNOSIS — N189 Chronic kidney disease, unspecified: Secondary | ICD-10-CM | POA: Diagnosis not present

## 2017-01-01 DIAGNOSIS — C7951 Secondary malignant neoplasm of bone: Secondary | ICD-10-CM | POA: Diagnosis not present

## 2017-01-01 DIAGNOSIS — N289 Disorder of kidney and ureter, unspecified: Secondary | ICD-10-CM | POA: Diagnosis not present

## 2017-01-08 DIAGNOSIS — N289 Disorder of kidney and ureter, unspecified: Secondary | ICD-10-CM | POA: Diagnosis not present

## 2017-01-08 DIAGNOSIS — C61 Malignant neoplasm of prostate: Secondary | ICD-10-CM | POA: Diagnosis not present

## 2017-01-22 DIAGNOSIS — C7951 Secondary malignant neoplasm of bone: Secondary | ICD-10-CM | POA: Diagnosis not present

## 2017-01-22 DIAGNOSIS — Z5111 Encounter for antineoplastic chemotherapy: Secondary | ICD-10-CM | POA: Diagnosis not present

## 2017-01-22 DIAGNOSIS — Z905 Acquired absence of kidney: Secondary | ICD-10-CM | POA: Diagnosis not present

## 2017-01-22 DIAGNOSIS — Z888 Allergy status to other drugs, medicaments and biological substances status: Secondary | ICD-10-CM | POA: Diagnosis not present

## 2017-01-22 DIAGNOSIS — C61 Malignant neoplasm of prostate: Secondary | ICD-10-CM | POA: Diagnosis not present

## 2017-01-22 DIAGNOSIS — Z85528 Personal history of other malignant neoplasm of kidney: Secondary | ICD-10-CM | POA: Diagnosis not present

## 2017-01-22 DIAGNOSIS — Z9079 Acquired absence of other genital organ(s): Secondary | ICD-10-CM | POA: Diagnosis not present

## 2017-01-22 DIAGNOSIS — N281 Cyst of kidney, acquired: Secondary | ICD-10-CM | POA: Diagnosis not present

## 2017-01-22 DIAGNOSIS — Z923 Personal history of irradiation: Secondary | ICD-10-CM | POA: Diagnosis not present

## 2017-01-22 DIAGNOSIS — N289 Disorder of kidney and ureter, unspecified: Secondary | ICD-10-CM | POA: Diagnosis not present

## 2017-02-04 DIAGNOSIS — C649 Malignant neoplasm of unspecified kidney, except renal pelvis: Secondary | ICD-10-CM | POA: Diagnosis not present

## 2017-02-04 DIAGNOSIS — C61 Malignant neoplasm of prostate: Secondary | ICD-10-CM | POA: Diagnosis not present

## 2017-02-12 DIAGNOSIS — Z5111 Encounter for antineoplastic chemotherapy: Secondary | ICD-10-CM | POA: Diagnosis not present

## 2017-02-12 DIAGNOSIS — C7951 Secondary malignant neoplasm of bone: Secondary | ICD-10-CM | POA: Diagnosis not present

## 2017-02-12 DIAGNOSIS — C61 Malignant neoplasm of prostate: Secondary | ICD-10-CM | POA: Diagnosis not present

## 2017-02-12 DIAGNOSIS — N289 Disorder of kidney and ureter, unspecified: Secondary | ICD-10-CM | POA: Diagnosis not present

## 2017-02-12 DIAGNOSIS — Z5181 Encounter for therapeutic drug level monitoring: Secondary | ICD-10-CM | POA: Diagnosis not present

## 2017-02-12 DIAGNOSIS — Z79899 Other long term (current) drug therapy: Secondary | ICD-10-CM | POA: Diagnosis not present

## 2017-02-12 DIAGNOSIS — C649 Malignant neoplasm of unspecified kidney, except renal pelvis: Secondary | ICD-10-CM | POA: Diagnosis not present

## 2017-02-12 DIAGNOSIS — Z905 Acquired absence of kidney: Secondary | ICD-10-CM | POA: Diagnosis not present

## 2017-02-19 DIAGNOSIS — N189 Chronic kidney disease, unspecified: Secondary | ICD-10-CM | POA: Diagnosis not present

## 2017-02-19 DIAGNOSIS — E860 Dehydration: Secondary | ICD-10-CM | POA: Diagnosis present

## 2017-02-19 DIAGNOSIS — Z66 Do not resuscitate: Secondary | ICD-10-CM | POA: Diagnosis not present

## 2017-02-19 DIAGNOSIS — N179 Acute kidney failure, unspecified: Secondary | ICD-10-CM | POA: Diagnosis present

## 2017-02-19 DIAGNOSIS — T451X5A Adverse effect of antineoplastic and immunosuppressive drugs, initial encounter: Secondary | ICD-10-CM | POA: Diagnosis not present

## 2017-02-19 DIAGNOSIS — E876 Hypokalemia: Secondary | ICD-10-CM | POA: Diagnosis not present

## 2017-02-19 DIAGNOSIS — E872 Acidosis: Secondary | ICD-10-CM | POA: Diagnosis not present

## 2017-02-19 DIAGNOSIS — N289 Disorder of kidney and ureter, unspecified: Secondary | ICD-10-CM | POA: Diagnosis not present

## 2017-02-19 DIAGNOSIS — R197 Diarrhea, unspecified: Secondary | ICD-10-CM | POA: Diagnosis present

## 2017-02-19 DIAGNOSIS — C61 Malignant neoplasm of prostate: Secondary | ICD-10-CM | POA: Diagnosis present

## 2017-02-19 DIAGNOSIS — C7951 Secondary malignant neoplasm of bone: Secondary | ICD-10-CM | POA: Diagnosis present

## 2017-02-19 DIAGNOSIS — E878 Other disorders of electrolyte and fluid balance, not elsewhere classified: Secondary | ICD-10-CM | POA: Diagnosis not present

## 2017-02-19 DIAGNOSIS — K521 Toxic gastroenteritis and colitis: Secondary | ICD-10-CM | POA: Diagnosis not present

## 2017-02-19 DIAGNOSIS — C649 Malignant neoplasm of unspecified kidney, except renal pelvis: Secondary | ICD-10-CM | POA: Diagnosis not present

## 2017-02-19 DIAGNOSIS — I10 Essential (primary) hypertension: Secondary | ICD-10-CM | POA: Diagnosis not present

## 2017-02-19 DIAGNOSIS — E785 Hyperlipidemia, unspecified: Secondary | ICD-10-CM | POA: Diagnosis not present

## 2017-02-26 DIAGNOSIS — E875 Hyperkalemia: Secondary | ICD-10-CM | POA: Diagnosis not present

## 2017-02-26 DIAGNOSIS — C61 Malignant neoplasm of prostate: Secondary | ICD-10-CM | POA: Diagnosis not present

## 2017-02-26 DIAGNOSIS — R000 Tachycardia, unspecified: Secondary | ICD-10-CM | POA: Diagnosis not present

## 2017-02-27 DIAGNOSIS — C61 Malignant neoplasm of prostate: Secondary | ICD-10-CM | POA: Diagnosis not present

## 2017-03-05 DIAGNOSIS — Z888 Allergy status to other drugs, medicaments and biological substances status: Secondary | ICD-10-CM | POA: Diagnosis not present

## 2017-03-05 DIAGNOSIS — N281 Cyst of kidney, acquired: Secondary | ICD-10-CM | POA: Diagnosis not present

## 2017-03-05 DIAGNOSIS — M9971 Connective tissue and disc stenosis of intervertebral foramina of cervical region: Secondary | ICD-10-CM | POA: Diagnosis not present

## 2017-03-05 DIAGNOSIS — C651 Malignant neoplasm of right renal pelvis: Secondary | ICD-10-CM | POA: Diagnosis not present

## 2017-03-05 DIAGNOSIS — Z905 Acquired absence of kidney: Secondary | ICD-10-CM | POA: Diagnosis not present

## 2017-03-05 DIAGNOSIS — C649 Malignant neoplasm of unspecified kidney, except renal pelvis: Secondary | ICD-10-CM | POA: Diagnosis not present

## 2017-03-05 DIAGNOSIS — C7951 Secondary malignant neoplasm of bone: Secondary | ICD-10-CM | POA: Diagnosis not present

## 2017-03-05 DIAGNOSIS — M542 Cervicalgia: Secondary | ICD-10-CM | POA: Diagnosis not present

## 2017-03-05 DIAGNOSIS — Z9079 Acquired absence of other genital organ(s): Secondary | ICD-10-CM | POA: Diagnosis not present

## 2017-03-05 DIAGNOSIS — N289 Disorder of kidney and ureter, unspecified: Secondary | ICD-10-CM | POA: Diagnosis not present

## 2017-03-05 DIAGNOSIS — M47812 Spondylosis without myelopathy or radiculopathy, cervical region: Secondary | ICD-10-CM | POA: Diagnosis not present

## 2017-03-05 DIAGNOSIS — C61 Malignant neoplasm of prostate: Secondary | ICD-10-CM | POA: Diagnosis not present

## 2017-03-05 DIAGNOSIS — Z882 Allergy status to sulfonamides status: Secondary | ICD-10-CM | POA: Diagnosis not present

## 2017-03-26 DIAGNOSIS — Z79899 Other long term (current) drug therapy: Secondary | ICD-10-CM | POA: Diagnosis not present

## 2017-03-26 DIAGNOSIS — Z5181 Encounter for therapeutic drug level monitoring: Secondary | ICD-10-CM | POA: Diagnosis not present

## 2017-03-26 DIAGNOSIS — Z5111 Encounter for antineoplastic chemotherapy: Secondary | ICD-10-CM | POA: Diagnosis not present

## 2017-03-26 DIAGNOSIS — M542 Cervicalgia: Secondary | ICD-10-CM | POA: Diagnosis not present

## 2017-03-26 DIAGNOSIS — I10 Essential (primary) hypertension: Secondary | ICD-10-CM | POA: Diagnosis not present

## 2017-03-26 DIAGNOSIS — C7951 Secondary malignant neoplasm of bone: Secondary | ICD-10-CM | POA: Diagnosis not present

## 2017-03-26 DIAGNOSIS — C61 Malignant neoplasm of prostate: Secondary | ICD-10-CM | POA: Diagnosis not present

## 2017-03-26 DIAGNOSIS — N281 Cyst of kidney, acquired: Secondary | ICD-10-CM | POA: Diagnosis not present

## 2017-03-26 DIAGNOSIS — Z85528 Personal history of other malignant neoplasm of kidney: Secondary | ICD-10-CM | POA: Diagnosis not present

## 2017-03-26 DIAGNOSIS — Z905 Acquired absence of kidney: Secondary | ICD-10-CM | POA: Diagnosis not present

## 2017-04-08 DIAGNOSIS — H2511 Age-related nuclear cataract, right eye: Secondary | ICD-10-CM | POA: Diagnosis not present

## 2017-04-08 DIAGNOSIS — H524 Presbyopia: Secondary | ICD-10-CM | POA: Diagnosis not present

## 2017-04-08 DIAGNOSIS — C61 Malignant neoplasm of prostate: Secondary | ICD-10-CM | POA: Diagnosis not present

## 2017-04-08 DIAGNOSIS — H26492 Other secondary cataract, left eye: Secondary | ICD-10-CM | POA: Diagnosis not present

## 2017-04-08 DIAGNOSIS — H353132 Nonexudative age-related macular degeneration, bilateral, intermediate dry stage: Secondary | ICD-10-CM | POA: Diagnosis not present

## 2017-04-08 DIAGNOSIS — C641 Malignant neoplasm of right kidney, except renal pelvis: Secondary | ICD-10-CM | POA: Diagnosis not present

## 2017-04-30 DIAGNOSIS — C649 Malignant neoplasm of unspecified kidney, except renal pelvis: Secondary | ICD-10-CM | POA: Diagnosis not present

## 2017-04-30 DIAGNOSIS — Z7982 Long term (current) use of aspirin: Secondary | ICD-10-CM | POA: Diagnosis not present

## 2017-04-30 DIAGNOSIS — C7951 Secondary malignant neoplasm of bone: Secondary | ICD-10-CM | POA: Diagnosis not present

## 2017-04-30 DIAGNOSIS — Z5111 Encounter for antineoplastic chemotherapy: Secondary | ICD-10-CM | POA: Diagnosis not present

## 2017-04-30 DIAGNOSIS — Z79899 Other long term (current) drug therapy: Secondary | ICD-10-CM | POA: Diagnosis not present

## 2017-04-30 DIAGNOSIS — C61 Malignant neoplasm of prostate: Secondary | ICD-10-CM | POA: Diagnosis not present

## 2017-04-30 DIAGNOSIS — N281 Cyst of kidney, acquired: Secondary | ICD-10-CM | POA: Diagnosis not present

## 2017-04-30 DIAGNOSIS — I10 Essential (primary) hypertension: Secondary | ICD-10-CM | POA: Diagnosis not present

## 2017-04-30 DIAGNOSIS — Z888 Allergy status to other drugs, medicaments and biological substances status: Secondary | ICD-10-CM | POA: Diagnosis not present

## 2017-05-10 DIAGNOSIS — C61 Malignant neoplasm of prostate: Secondary | ICD-10-CM | POA: Diagnosis not present

## 2017-05-10 DIAGNOSIS — C649 Malignant neoplasm of unspecified kidney, except renal pelvis: Secondary | ICD-10-CM | POA: Diagnosis not present

## 2017-05-21 DIAGNOSIS — C61 Malignant neoplasm of prostate: Secondary | ICD-10-CM | POA: Diagnosis not present

## 2017-05-21 DIAGNOSIS — Z85528 Personal history of other malignant neoplasm of kidney: Secondary | ICD-10-CM | POA: Diagnosis not present

## 2017-06-01 DIAGNOSIS — Z23 Encounter for immunization: Secondary | ICD-10-CM | POA: Diagnosis not present

## 2017-06-12 DIAGNOSIS — N2581 Secondary hyperparathyroidism of renal origin: Secondary | ICD-10-CM | POA: Diagnosis not present

## 2017-06-12 DIAGNOSIS — N183 Chronic kidney disease, stage 3 (moderate): Secondary | ICD-10-CM | POA: Diagnosis not present

## 2017-06-12 DIAGNOSIS — D631 Anemia in chronic kidney disease: Secondary | ICD-10-CM | POA: Diagnosis not present

## 2017-06-13 DIAGNOSIS — L821 Other seborrheic keratosis: Secondary | ICD-10-CM | POA: Diagnosis not present

## 2017-06-13 DIAGNOSIS — L570 Actinic keratosis: Secondary | ICD-10-CM | POA: Diagnosis not present

## 2017-06-13 DIAGNOSIS — D692 Other nonthrombocytopenic purpura: Secondary | ICD-10-CM | POA: Diagnosis not present

## 2017-06-13 DIAGNOSIS — D225 Melanocytic nevi of trunk: Secondary | ICD-10-CM | POA: Diagnosis not present

## 2017-06-13 DIAGNOSIS — D1801 Hemangioma of skin and subcutaneous tissue: Secondary | ICD-10-CM | POA: Diagnosis not present

## 2017-06-13 DIAGNOSIS — L400 Psoriasis vulgaris: Secondary | ICD-10-CM | POA: Diagnosis not present

## 2017-06-18 DIAGNOSIS — R9721 Rising PSA following treatment for malignant neoplasm of prostate: Secondary | ICD-10-CM | POA: Diagnosis not present

## 2017-06-18 DIAGNOSIS — C7951 Secondary malignant neoplasm of bone: Secondary | ICD-10-CM | POA: Diagnosis not present

## 2017-06-18 DIAGNOSIS — Z905 Acquired absence of kidney: Secondary | ICD-10-CM | POA: Diagnosis not present

## 2017-06-18 DIAGNOSIS — Z85528 Personal history of other malignant neoplasm of kidney: Secondary | ICD-10-CM | POA: Diagnosis not present

## 2017-06-18 DIAGNOSIS — I10 Essential (primary) hypertension: Secondary | ICD-10-CM | POA: Diagnosis not present

## 2017-06-18 DIAGNOSIS — C61 Malignant neoplasm of prostate: Secondary | ICD-10-CM | POA: Diagnosis not present

## 2017-06-18 DIAGNOSIS — Z79899 Other long term (current) drug therapy: Secondary | ICD-10-CM | POA: Diagnosis not present

## 2017-06-18 DIAGNOSIS — N289 Disorder of kidney and ureter, unspecified: Secondary | ICD-10-CM | POA: Diagnosis not present

## 2017-07-12 DIAGNOSIS — N183 Chronic kidney disease, stage 3 (moderate): Secondary | ICD-10-CM | POA: Diagnosis not present

## 2017-07-30 DIAGNOSIS — Z5181 Encounter for therapeutic drug level monitoring: Secondary | ICD-10-CM | POA: Diagnosis not present

## 2017-07-30 DIAGNOSIS — Z5111 Encounter for antineoplastic chemotherapy: Secondary | ICD-10-CM | POA: Diagnosis not present

## 2017-07-30 DIAGNOSIS — C61 Malignant neoplasm of prostate: Secondary | ICD-10-CM | POA: Diagnosis not present

## 2017-07-30 DIAGNOSIS — R531 Weakness: Secondary | ICD-10-CM | POA: Diagnosis not present

## 2017-07-30 DIAGNOSIS — Z79899 Other long term (current) drug therapy: Secondary | ICD-10-CM | POA: Diagnosis not present

## 2017-07-30 DIAGNOSIS — C7951 Secondary malignant neoplasm of bone: Secondary | ICD-10-CM | POA: Diagnosis not present

## 2017-07-30 DIAGNOSIS — C649 Malignant neoplasm of unspecified kidney, except renal pelvis: Secondary | ICD-10-CM | POA: Diagnosis not present

## 2017-07-30 DIAGNOSIS — N289 Disorder of kidney and ureter, unspecified: Secondary | ICD-10-CM | POA: Diagnosis not present

## 2017-07-30 DIAGNOSIS — I10 Essential (primary) hypertension: Secondary | ICD-10-CM | POA: Diagnosis not present

## 2017-08-12 DIAGNOSIS — N528 Other male erectile dysfunction: Secondary | ICD-10-CM | POA: Diagnosis not present

## 2017-08-12 DIAGNOSIS — R972 Elevated prostate specific antigen [PSA]: Secondary | ICD-10-CM | POA: Diagnosis not present

## 2017-08-12 DIAGNOSIS — N281 Cyst of kidney, acquired: Secondary | ICD-10-CM | POA: Diagnosis not present

## 2017-08-12 DIAGNOSIS — C61 Malignant neoplasm of prostate: Secondary | ICD-10-CM | POA: Diagnosis not present

## 2017-08-12 DIAGNOSIS — Z85528 Personal history of other malignant neoplasm of kidney: Secondary | ICD-10-CM | POA: Diagnosis not present

## 2017-08-12 DIAGNOSIS — N179 Acute kidney failure, unspecified: Secondary | ICD-10-CM | POA: Diagnosis not present

## 2017-09-24 DIAGNOSIS — C61 Malignant neoplasm of prostate: Secondary | ICD-10-CM | POA: Diagnosis not present

## 2017-09-24 DIAGNOSIS — Z7952 Long term (current) use of systemic steroids: Secondary | ICD-10-CM | POA: Diagnosis not present

## 2017-09-24 DIAGNOSIS — C649 Malignant neoplasm of unspecified kidney, except renal pelvis: Secondary | ICD-10-CM | POA: Diagnosis not present

## 2017-09-24 DIAGNOSIS — C641 Malignant neoplasm of right kidney, except renal pelvis: Secondary | ICD-10-CM | POA: Diagnosis not present

## 2017-09-24 DIAGNOSIS — M8580 Other specified disorders of bone density and structure, unspecified site: Secondary | ICD-10-CM | POA: Diagnosis not present

## 2017-09-30 ENCOUNTER — Encounter: Payer: Self-pay | Admitting: Podiatry

## 2017-09-30 ENCOUNTER — Ambulatory Visit (INDEPENDENT_AMBULATORY_CARE_PROVIDER_SITE_OTHER): Payer: Medicare Other | Admitting: Podiatry

## 2017-09-30 DIAGNOSIS — M79676 Pain in unspecified toe(s): Secondary | ICD-10-CM

## 2017-09-30 DIAGNOSIS — B351 Tinea unguium: Secondary | ICD-10-CM | POA: Diagnosis not present

## 2017-09-30 DIAGNOSIS — M79609 Pain in unspecified limb: Secondary | ICD-10-CM

## 2017-09-30 DIAGNOSIS — L84 Corns and callosities: Secondary | ICD-10-CM | POA: Diagnosis not present

## 2017-09-30 NOTE — Progress Notes (Signed)
Subjective:   Patient ID: [name], male   DOB: 84 y.o.   MRN: [id number]   HPI Patient presents with painful lesion on the right second digit with keratotic tissue formation and nail disease 1-5 both feet that are thick yellow brittle and can be painful when pressed   ROS      Objective:  Physical Exam  Thick yellow brittle nailbeds 1-5 both feet with lesion right that is painful when palpated     Assessment:  Mycotic nail infection and lesion formation     Plan:  Standard debridement of nailbeds 1-5 both feet and lesion with no iatrogenic bleeding and instructed on continued routine care wider shoes and will be seen back in approximate 3 months

## 2017-10-03 ENCOUNTER — Ambulatory Visit: Payer: Medicare Other | Admitting: Podiatry

## 2017-11-14 DIAGNOSIS — E7849 Other hyperlipidemia: Secondary | ICD-10-CM | POA: Diagnosis not present

## 2017-11-14 DIAGNOSIS — I10 Essential (primary) hypertension: Secondary | ICD-10-CM | POA: Diagnosis not present

## 2017-11-14 DIAGNOSIS — M859 Disorder of bone density and structure, unspecified: Secondary | ICD-10-CM | POA: Diagnosis not present

## 2017-11-15 DIAGNOSIS — R82998 Other abnormal findings in urine: Secondary | ICD-10-CM | POA: Diagnosis not present

## 2017-11-15 DIAGNOSIS — N183 Chronic kidney disease, stage 3 (moderate): Secondary | ICD-10-CM | POA: Diagnosis not present

## 2017-11-19 DIAGNOSIS — N289 Disorder of kidney and ureter, unspecified: Secondary | ICD-10-CM | POA: Diagnosis not present

## 2017-11-19 DIAGNOSIS — C61 Malignant neoplasm of prostate: Secondary | ICD-10-CM | POA: Diagnosis not present

## 2017-11-19 DIAGNOSIS — C641 Malignant neoplasm of right kidney, except renal pelvis: Secondary | ICD-10-CM | POA: Diagnosis not present

## 2017-11-19 DIAGNOSIS — Z79899 Other long term (current) drug therapy: Secondary | ICD-10-CM | POA: Diagnosis not present

## 2017-11-19 DIAGNOSIS — R32 Unspecified urinary incontinence: Secondary | ICD-10-CM | POA: Diagnosis not present

## 2017-11-19 DIAGNOSIS — D649 Anemia, unspecified: Secondary | ICD-10-CM | POA: Diagnosis not present

## 2017-11-19 DIAGNOSIS — D696 Thrombocytopenia, unspecified: Secondary | ICD-10-CM | POA: Diagnosis not present

## 2017-11-19 DIAGNOSIS — I10 Essential (primary) hypertension: Secondary | ICD-10-CM | POA: Diagnosis not present

## 2017-11-19 DIAGNOSIS — Z5111 Encounter for antineoplastic chemotherapy: Secondary | ICD-10-CM | POA: Diagnosis not present

## 2017-11-19 DIAGNOSIS — C649 Malignant neoplasm of unspecified kidney, except renal pelvis: Secondary | ICD-10-CM | POA: Diagnosis not present

## 2017-11-19 DIAGNOSIS — C7951 Secondary malignant neoplasm of bone: Secondary | ICD-10-CM | POA: Diagnosis not present

## 2017-11-19 DIAGNOSIS — N281 Cyst of kidney, acquired: Secondary | ICD-10-CM | POA: Diagnosis not present

## 2017-11-21 DIAGNOSIS — Z0000 Encounter for general adult medical examination without abnormal findings: Secondary | ICD-10-CM | POA: Diagnosis not present

## 2017-11-21 DIAGNOSIS — D6489 Other specified anemias: Secondary | ICD-10-CM | POA: Diagnosis not present

## 2017-11-21 DIAGNOSIS — E7849 Other hyperlipidemia: Secondary | ICD-10-CM | POA: Diagnosis not present

## 2017-11-21 DIAGNOSIS — N183 Chronic kidney disease, stage 3 (moderate): Secondary | ICD-10-CM | POA: Diagnosis not present

## 2017-11-21 DIAGNOSIS — G4709 Other insomnia: Secondary | ICD-10-CM | POA: Diagnosis not present

## 2017-11-21 DIAGNOSIS — Z681 Body mass index (BMI) 19 or less, adult: Secondary | ICD-10-CM | POA: Diagnosis not present

## 2017-11-21 DIAGNOSIS — C61 Malignant neoplasm of prostate: Secondary | ICD-10-CM | POA: Diagnosis not present

## 2017-11-21 DIAGNOSIS — Z1389 Encounter for screening for other disorder: Secondary | ICD-10-CM | POA: Diagnosis not present

## 2017-11-21 DIAGNOSIS — I10 Essential (primary) hypertension: Secondary | ICD-10-CM | POA: Diagnosis not present

## 2017-11-21 DIAGNOSIS — E46 Unspecified protein-calorie malnutrition: Secondary | ICD-10-CM | POA: Diagnosis not present

## 2017-11-21 DIAGNOSIS — C649 Malignant neoplasm of unspecified kidney, except renal pelvis: Secondary | ICD-10-CM | POA: Diagnosis not present

## 2017-11-21 DIAGNOSIS — R5383 Other fatigue: Secondary | ICD-10-CM | POA: Diagnosis not present

## 2017-12-10 DIAGNOSIS — D61818 Other pancytopenia: Secondary | ICD-10-CM | POA: Diagnosis not present

## 2017-12-10 DIAGNOSIS — I10 Essential (primary) hypertension: Secondary | ICD-10-CM | POA: Diagnosis not present

## 2017-12-10 DIAGNOSIS — Z905 Acquired absence of kidney: Secondary | ICD-10-CM | POA: Diagnosis not present

## 2017-12-10 DIAGNOSIS — R32 Unspecified urinary incontinence: Secondary | ICD-10-CM | POA: Diagnosis not present

## 2017-12-10 DIAGNOSIS — D649 Anemia, unspecified: Secondary | ICD-10-CM | POA: Diagnosis not present

## 2017-12-10 DIAGNOSIS — C7951 Secondary malignant neoplasm of bone: Secondary | ICD-10-CM | POA: Diagnosis not present

## 2017-12-10 DIAGNOSIS — C641 Malignant neoplasm of right kidney, except renal pelvis: Secondary | ICD-10-CM | POA: Diagnosis not present

## 2017-12-10 DIAGNOSIS — C61 Malignant neoplasm of prostate: Secondary | ICD-10-CM | POA: Diagnosis not present

## 2017-12-10 DIAGNOSIS — K8681 Exocrine pancreatic insufficiency: Secondary | ICD-10-CM | POA: Diagnosis not present

## 2017-12-26 ENCOUNTER — Ambulatory Visit (INDEPENDENT_AMBULATORY_CARE_PROVIDER_SITE_OTHER): Payer: Medicare Other | Admitting: Podiatry

## 2017-12-26 ENCOUNTER — Encounter: Payer: Self-pay | Admitting: Podiatry

## 2017-12-26 DIAGNOSIS — L03032 Cellulitis of left toe: Secondary | ICD-10-CM

## 2017-12-26 NOTE — Patient Instructions (Signed)

## 2017-12-27 NOTE — Progress Notes (Signed)
Subjective:   Patient ID: [name], male   DOB: 84 y.o.   MRN: [id number]   HPI Patient states she is developed an ingrown toenail of the left big toe and is been very sore and making it hard for him to wear shoe gear   ROS      Objective:  Physical Exam  Neurovascular status is unchanged with incurvated left hallux medial border that is painful when pressed with no active drainage and slight redness noted distal     Assessment:  Localized paronychia infection left hallux with no active drainage noted     Plan:  H&P condition reviewed and I recommended removal of the border explaining the procedure and I infiltrated the left hallux 60 mill grams like Marcaine mixture sterile prep applied and then remove the medial border I did note a slight bit of distal drainage I cleaned this tissue out and removed any necrotic tissue and allow channel for drainage and applied sterile dressing.  Gave instructions on soaks and gave instructions on any proximal redness or other issues to inform us immediately

## 2018-01-02 DIAGNOSIS — C7951 Secondary malignant neoplasm of bone: Secondary | ICD-10-CM | POA: Diagnosis not present

## 2018-01-02 DIAGNOSIS — R32 Unspecified urinary incontinence: Secondary | ICD-10-CM | POA: Diagnosis not present

## 2018-01-02 DIAGNOSIS — Z8546 Personal history of malignant neoplasm of prostate: Secondary | ICD-10-CM | POA: Diagnosis not present

## 2018-01-02 DIAGNOSIS — N289 Disorder of kidney and ureter, unspecified: Secondary | ICD-10-CM | POA: Diagnosis not present

## 2018-01-02 DIAGNOSIS — D649 Anemia, unspecified: Secondary | ICD-10-CM | POA: Diagnosis not present

## 2018-01-02 DIAGNOSIS — N19 Unspecified kidney failure: Secondary | ICD-10-CM | POA: Diagnosis not present

## 2018-01-02 DIAGNOSIS — C641 Malignant neoplasm of right kidney, except renal pelvis: Secondary | ICD-10-CM | POA: Diagnosis not present

## 2018-01-02 DIAGNOSIS — I10 Essential (primary) hypertension: Secondary | ICD-10-CM | POA: Diagnosis not present

## 2018-01-02 DIAGNOSIS — M8580 Other specified disorders of bone density and structure, unspecified site: Secondary | ICD-10-CM | POA: Diagnosis not present

## 2018-01-02 DIAGNOSIS — C61 Malignant neoplasm of prostate: Secondary | ICD-10-CM | POA: Diagnosis not present

## 2018-01-02 DIAGNOSIS — D696 Thrombocytopenia, unspecified: Secondary | ICD-10-CM | POA: Diagnosis not present

## 2018-01-02 DIAGNOSIS — M1611 Unilateral primary osteoarthritis, right hip: Secondary | ICD-10-CM | POA: Diagnosis not present

## 2018-01-02 DIAGNOSIS — R0609 Other forms of dyspnea: Secondary | ICD-10-CM | POA: Diagnosis not present

## 2018-01-02 DIAGNOSIS — R531 Weakness: Secondary | ICD-10-CM | POA: Diagnosis not present

## 2018-01-02 DIAGNOSIS — M85851 Other specified disorders of bone density and structure, right thigh: Secondary | ICD-10-CM | POA: Diagnosis not present

## 2018-01-23 DIAGNOSIS — C641 Malignant neoplasm of right kidney, except renal pelvis: Secondary | ICD-10-CM | POA: Diagnosis not present

## 2018-01-23 DIAGNOSIS — N281 Cyst of kidney, acquired: Secondary | ICD-10-CM | POA: Diagnosis not present

## 2018-01-23 DIAGNOSIS — R32 Unspecified urinary incontinence: Secondary | ICD-10-CM | POA: Diagnosis not present

## 2018-01-23 DIAGNOSIS — Z79899 Other long term (current) drug therapy: Secondary | ICD-10-CM | POA: Diagnosis not present

## 2018-01-23 DIAGNOSIS — R0609 Other forms of dyspnea: Secondary | ICD-10-CM | POA: Diagnosis not present

## 2018-01-23 DIAGNOSIS — N19 Unspecified kidney failure: Secondary | ICD-10-CM | POA: Diagnosis not present

## 2018-01-23 DIAGNOSIS — D696 Thrombocytopenia, unspecified: Secondary | ICD-10-CM | POA: Diagnosis not present

## 2018-01-23 DIAGNOSIS — I10 Essential (primary) hypertension: Secondary | ICD-10-CM | POA: Diagnosis not present

## 2018-01-23 DIAGNOSIS — C61 Malignant neoplasm of prostate: Secondary | ICD-10-CM | POA: Diagnosis not present

## 2018-01-23 DIAGNOSIS — D649 Anemia, unspecified: Secondary | ICD-10-CM | POA: Diagnosis not present

## 2018-01-23 DIAGNOSIS — R531 Weakness: Secondary | ICD-10-CM | POA: Diagnosis not present

## 2018-01-23 DIAGNOSIS — C7951 Secondary malignant neoplasm of bone: Secondary | ICD-10-CM | POA: Diagnosis not present

## 2018-01-23 DIAGNOSIS — Z5111 Encounter for antineoplastic chemotherapy: Secondary | ICD-10-CM | POA: Diagnosis not present

## 2018-01-24 DIAGNOSIS — W19XXXA Unspecified fall, initial encounter: Secondary | ICD-10-CM | POA: Diagnosis not present

## 2018-01-24 DIAGNOSIS — I10 Essential (primary) hypertension: Secondary | ICD-10-CM | POA: Diagnosis not present

## 2018-01-24 DIAGNOSIS — Z681 Body mass index (BMI) 19 or less, adult: Secondary | ICD-10-CM | POA: Diagnosis not present

## 2018-01-24 DIAGNOSIS — S40812A Abrasion of left upper arm, initial encounter: Secondary | ICD-10-CM | POA: Diagnosis not present

## 2018-01-28 DIAGNOSIS — I10 Essential (primary) hypertension: Secondary | ICD-10-CM | POA: Diagnosis not present

## 2018-01-28 DIAGNOSIS — D6489 Other specified anemias: Secondary | ICD-10-CM | POA: Diagnosis not present

## 2018-01-28 DIAGNOSIS — R5383 Other fatigue: Secondary | ICD-10-CM | POA: Diagnosis not present

## 2018-01-28 DIAGNOSIS — Z681 Body mass index (BMI) 19 or less, adult: Secondary | ICD-10-CM | POA: Diagnosis not present

## 2018-01-28 DIAGNOSIS — E46 Unspecified protein-calorie malnutrition: Secondary | ICD-10-CM | POA: Diagnosis not present

## 2018-01-28 DIAGNOSIS — C61 Malignant neoplasm of prostate: Secondary | ICD-10-CM | POA: Diagnosis not present

## 2018-02-03 DIAGNOSIS — N281 Cyst of kidney, acquired: Secondary | ICD-10-CM | POA: Diagnosis not present

## 2018-02-03 DIAGNOSIS — Z85528 Personal history of other malignant neoplasm of kidney: Secondary | ICD-10-CM | POA: Diagnosis not present

## 2018-02-03 DIAGNOSIS — N183 Chronic kidney disease, stage 3 (moderate): Secondary | ICD-10-CM | POA: Diagnosis not present

## 2018-02-03 DIAGNOSIS — N529 Male erectile dysfunction, unspecified: Secondary | ICD-10-CM | POA: Diagnosis not present

## 2018-02-03 DIAGNOSIS — N3946 Mixed incontinence: Secondary | ICD-10-CM | POA: Diagnosis not present

## 2018-02-03 DIAGNOSIS — C61 Malignant neoplasm of prostate: Secondary | ICD-10-CM | POA: Diagnosis not present

## 2018-02-03 DIAGNOSIS — R972 Elevated prostate specific antigen [PSA]: Secondary | ICD-10-CM | POA: Diagnosis not present

## 2018-02-12 DIAGNOSIS — Z681 Body mass index (BMI) 19 or less, adult: Secondary | ICD-10-CM | POA: Diagnosis not present

## 2018-02-12 DIAGNOSIS — S40812D Abrasion of left upper arm, subsequent encounter: Secondary | ICD-10-CM | POA: Diagnosis not present

## 2018-02-13 DIAGNOSIS — D649 Anemia, unspecified: Secondary | ICD-10-CM | POA: Diagnosis not present

## 2018-02-13 DIAGNOSIS — C649 Malignant neoplasm of unspecified kidney, except renal pelvis: Secondary | ICD-10-CM | POA: Diagnosis not present

## 2018-02-13 DIAGNOSIS — C61 Malignant neoplasm of prostate: Secondary | ICD-10-CM | POA: Diagnosis not present

## 2018-02-14 DIAGNOSIS — C61 Malignant neoplasm of prostate: Secondary | ICD-10-CM | POA: Diagnosis not present

## 2018-03-06 DIAGNOSIS — C641 Malignant neoplasm of right kidney, except renal pelvis: Secondary | ICD-10-CM | POA: Diagnosis not present

## 2018-03-06 DIAGNOSIS — Z5111 Encounter for antineoplastic chemotherapy: Secondary | ICD-10-CM | POA: Diagnosis not present

## 2018-03-06 DIAGNOSIS — C61 Malignant neoplasm of prostate: Secondary | ICD-10-CM | POA: Diagnosis not present

## 2018-03-11 DIAGNOSIS — Z681 Body mass index (BMI) 19 or less, adult: Secondary | ICD-10-CM | POA: Diagnosis not present

## 2018-03-11 DIAGNOSIS — I10 Essential (primary) hypertension: Secondary | ICD-10-CM | POA: Diagnosis not present

## 2018-03-11 DIAGNOSIS — E46 Unspecified protein-calorie malnutrition: Secondary | ICD-10-CM | POA: Diagnosis not present

## 2018-03-11 DIAGNOSIS — C61 Malignant neoplasm of prostate: Secondary | ICD-10-CM | POA: Diagnosis not present

## 2018-03-11 DIAGNOSIS — R5383 Other fatigue: Secondary | ICD-10-CM | POA: Diagnosis not present

## 2018-03-11 DIAGNOSIS — D6489 Other specified anemias: Secondary | ICD-10-CM | POA: Diagnosis not present

## 2018-03-27 DIAGNOSIS — N281 Cyst of kidney, acquired: Secondary | ICD-10-CM | POA: Diagnosis not present

## 2018-03-27 DIAGNOSIS — R32 Unspecified urinary incontinence: Secondary | ICD-10-CM | POA: Diagnosis not present

## 2018-03-27 DIAGNOSIS — D649 Anemia, unspecified: Secondary | ICD-10-CM | POA: Diagnosis not present

## 2018-03-27 DIAGNOSIS — C7951 Secondary malignant neoplasm of bone: Secondary | ICD-10-CM | POA: Diagnosis not present

## 2018-03-27 DIAGNOSIS — Z192 Hormone resistant malignancy status: Secondary | ICD-10-CM | POA: Diagnosis not present

## 2018-03-27 DIAGNOSIS — C649 Malignant neoplasm of unspecified kidney, except renal pelvis: Secondary | ICD-10-CM | POA: Diagnosis not present

## 2018-03-27 DIAGNOSIS — D696 Thrombocytopenia, unspecified: Secondary | ICD-10-CM | POA: Diagnosis not present

## 2018-03-27 DIAGNOSIS — D61818 Other pancytopenia: Secondary | ICD-10-CM | POA: Diagnosis not present

## 2018-03-27 DIAGNOSIS — C641 Malignant neoplasm of right kidney, except renal pelvis: Secondary | ICD-10-CM | POA: Diagnosis not present

## 2018-03-27 DIAGNOSIS — N289 Disorder of kidney and ureter, unspecified: Secondary | ICD-10-CM | POA: Diagnosis not present

## 2018-03-27 DIAGNOSIS — R972 Elevated prostate specific antigen [PSA]: Secondary | ICD-10-CM | POA: Diagnosis not present

## 2018-03-27 DIAGNOSIS — M8580 Other specified disorders of bone density and structure, unspecified site: Secondary | ICD-10-CM | POA: Diagnosis not present

## 2018-03-27 DIAGNOSIS — C61 Malignant neoplasm of prostate: Secondary | ICD-10-CM | POA: Diagnosis not present

## 2018-03-27 DIAGNOSIS — I10 Essential (primary) hypertension: Secondary | ICD-10-CM | POA: Diagnosis not present

## 2018-04-07 ENCOUNTER — Ambulatory Visit (INDEPENDENT_AMBULATORY_CARE_PROVIDER_SITE_OTHER): Payer: Medicare Other | Admitting: Podiatry

## 2018-04-07 ENCOUNTER — Other Ambulatory Visit: Payer: Self-pay | Admitting: Podiatry

## 2018-04-07 ENCOUNTER — Encounter: Payer: Self-pay | Admitting: Podiatry

## 2018-04-07 ENCOUNTER — Ambulatory Visit (INDEPENDENT_AMBULATORY_CARE_PROVIDER_SITE_OTHER): Payer: Medicare Other

## 2018-04-07 DIAGNOSIS — M79674 Pain in right toe(s): Secondary | ICD-10-CM

## 2018-04-07 DIAGNOSIS — M1000 Idiopathic gout, unspecified site: Secondary | ICD-10-CM | POA: Diagnosis not present

## 2018-04-07 DIAGNOSIS — M779 Enthesopathy, unspecified: Secondary | ICD-10-CM

## 2018-04-07 MED ORDER — TRIAMCINOLONE ACETONIDE 10 MG/ML IJ SUSP
10.0000 mg | Freq: Once | INTRAMUSCULAR | Status: AC
  Administered 2018-04-07: 10 mg

## 2018-04-07 NOTE — Progress Notes (Signed)
Subjective:   Patient ID: [name], male   DOB: 84 y.o.   MRN: [id number]   HPI Patient presents stating his right big toe has become inflamed and is due to go out of town and thinks it might be gout   ROS      Objective:  Physical Exam  Neurovascular status unchanged with patient found to have inflammation of the inner phalangeal joint right big toe with fluid buildup around the joint that is painful when palpated.  There is redness also associated with it but no drainage     Assessment:  Possibility for gout or inflammatory disease versus a localized process or fracture     Plan:  H&P x-ray reviewed and today I did the inner phalangeal joint injection 3 mg Kenalog pyelogram Xylocaine discussed gout and educated him on the possibility of gout for this condition.  He will be seen back if symptoms persist  X-rays indicated that there is no signs of fracture or advanced arthritis of the joint

## 2018-04-15 DIAGNOSIS — H2511 Age-related nuclear cataract, right eye: Secondary | ICD-10-CM | POA: Diagnosis not present

## 2018-04-15 DIAGNOSIS — H35373 Puckering of macula, bilateral: Secondary | ICD-10-CM | POA: Diagnosis not present

## 2018-04-15 DIAGNOSIS — H52203 Unspecified astigmatism, bilateral: Secondary | ICD-10-CM | POA: Diagnosis not present

## 2018-04-15 DIAGNOSIS — H353132 Nonexudative age-related macular degeneration, bilateral, intermediate dry stage: Secondary | ICD-10-CM | POA: Diagnosis not present

## 2018-04-24 DIAGNOSIS — D6489 Other specified anemias: Secondary | ICD-10-CM | POA: Diagnosis not present

## 2018-04-24 DIAGNOSIS — Z681 Body mass index (BMI) 19 or less, adult: Secondary | ICD-10-CM | POA: Diagnosis not present

## 2018-04-24 DIAGNOSIS — N183 Chronic kidney disease, stage 3 (moderate): Secondary | ICD-10-CM | POA: Diagnosis not present

## 2018-04-24 DIAGNOSIS — I10 Essential (primary) hypertension: Secondary | ICD-10-CM | POA: Diagnosis not present

## 2018-04-24 DIAGNOSIS — M5489 Other dorsalgia: Secondary | ICD-10-CM | POA: Diagnosis not present

## 2018-04-24 DIAGNOSIS — C61 Malignant neoplasm of prostate: Secondary | ICD-10-CM | POA: Diagnosis not present

## 2018-04-28 ENCOUNTER — Other Ambulatory Visit: Payer: Self-pay | Admitting: Internal Medicine

## 2018-04-28 DIAGNOSIS — C61 Malignant neoplasm of prostate: Secondary | ICD-10-CM

## 2018-05-04 ENCOUNTER — Ambulatory Visit
Admission: RE | Admit: 2018-05-04 | Discharge: 2018-05-04 | Disposition: A | Discharge status: Home / Self Care | Payer: Medicare Other | Source: Ambulatory Visit | Attending: Internal Medicine | Admitting: Internal Medicine

## 2018-05-04 DIAGNOSIS — C61 Malignant neoplasm of prostate: Secondary | ICD-10-CM

## 2018-05-04 DIAGNOSIS — M5416 Radiculopathy, lumbar region: Secondary | ICD-10-CM | POA: Diagnosis not present

## 2018-05-06 DIAGNOSIS — Z905 Acquired absence of kidney: Secondary | ICD-10-CM | POA: Diagnosis not present

## 2018-05-06 DIAGNOSIS — C61 Malignant neoplasm of prostate: Secondary | ICD-10-CM | POA: Diagnosis not present

## 2018-05-06 DIAGNOSIS — D696 Thrombocytopenia, unspecified: Secondary | ICD-10-CM | POA: Diagnosis not present

## 2018-05-06 DIAGNOSIS — I129 Hypertensive chronic kidney disease with stage 1 through stage 4 chronic kidney disease, or unspecified chronic kidney disease: Secondary | ICD-10-CM | POA: Diagnosis not present

## 2018-05-06 DIAGNOSIS — C641 Malignant neoplasm of right kidney, except renal pelvis: Secondary | ICD-10-CM | POA: Diagnosis not present

## 2018-05-06 DIAGNOSIS — D61818 Other pancytopenia: Secondary | ICD-10-CM | POA: Diagnosis not present

## 2018-05-06 DIAGNOSIS — D649 Anemia, unspecified: Secondary | ICD-10-CM | POA: Diagnosis not present

## 2018-05-06 DIAGNOSIS — N183 Chronic kidney disease, stage 3 (moderate): Secondary | ICD-10-CM | POA: Diagnosis not present

## 2018-05-06 DIAGNOSIS — Z192 Hormone resistant malignancy status: Secondary | ICD-10-CM | POA: Diagnosis not present

## 2018-05-07 NOTE — Progress Notes (Signed)
Histology and Location of Primary Cancer: Metastatic prostate to spine  Sites of Visceral and Bony Metastatic Disease: Spine: L3-4  Location(s) of Symptomatic Metastases: L3-4  CT CAP 06/28/2009: no mets, sclerosis L4  MRI Lumbar 05/04/2018: Right kidney appears to be surgically absent, partially visible multiple left renal cysts, including a large at least 7.5 cm diameter but simple appearing cyst at the L3-L4 spinal level.  There is mild retroperitoneal/prevertebral lymphadenopathy.  Abnormally enlarged lymph nodes anterior to the lumbar spine individually measure up to 20 mm short axis.  IMPRESSION: 1. Positive for diffuse osseous metastatic disease throughout the visible spine and pelvis. No pathologic fracture or malignant neural impingement identified. Associated prevertebral lymphadenopathy compatible with metastatic retroperitoneal nodal involvement. 2. Solitary left kidney with polycystic disease.  Past/Anticipated chemotherapy by medical oncology, if any:  Dr. Marcello Moores 05/06/2018 Present -Metastatic, castrate resistant prostate cancer:. Appears to have symptomatic metastasis with progressive pain in the lower lumbar spine. For his pain we will try oxycodone 2.5-5 mg every 4 hours. In addition, he will be referred to [facility] for consideration of palliative radiation. There is some concern about his cytopenias and blood counts will have to be followed closely if he does receive RT. We plan to administer 2 units pRBCs today both for anemia symptoms and to prevent development of even more severe anemia with RT.  -There are limited options for further systemic therapy due to PS and marrow involvement. He also had declined chemotherapy. He has arranged with his PCP for supportive care locally in [place] and is actively considering hospice.. Mr. Zuercher will continue to see Dr. Sharlett Iles and return to see [place] in 6 weeks. He will call if problems arise in the interim.  -Pancytopenia, anemia  and thrombocytopenia predominance: Likely due to underlying infiltrative prostate cancer.in bone marow (leukoerythroblastic picture) along with renal insufficiency.. Abnormal marrow signal noted on recent spinal MR. Although he has been tolerating the anemia quite well, he seems to be developing symptoms when the hemoglobin is in range of 7.5 or less. Recommend he go ahead and receive 2 unit PBRC today to help symptoms and optimize tentative planned radiation therapy.  History -An MRI on 02/13/11 showed a lesion at L4 but this was stable and not clear if this is healed metastatic lesion. The cyst and nodule in the left kidney were also stable and classified as Bosniak 3.A 3.28 on 04/21/12, 12.2 on 10/27/12, 5.45 on 01/19/13, 6.48 on 02/23/13, 7.76 on 04/20/13. PSA continued to rise to 10.0 on 05/28/13. On 06/03/13 bone scan showed abnormal uptake at T10 and L4. 06/03/13 CT CAP showed 3 mm RUL nodule, stable left hepatic lobe lesions 6.9 x 5.5 cm and 3.6 x 3.6 cm, sclerosis T10, L4, lytic lesion left 7th rib and rt iliac wing and rt thyroid lesion. However, L4 lesion has been noted previously and despite intense signal on CT, very modest uptake on bone scan. This suggested that sclerosis on CT may be healed tissue perhaps with some active tumor. -Of concern is report of lytic lesions iliac and rib which would be atypical for prostate. However, bx of lesions from 06/10/13 was negative showing marrow elements. Also, scan review suggested changes were not sufficient to label as true lytic process. Nonetheless, PSA rose again to 12.6 on 06/22/13. Because of progressive rise, he was subsequently started on enzalutamide 10/15/13. Dose was reduced later to 120 mg/d for side effects but he experienced a nice biochemical response. However, he later progressed and treatment was stopped. Mr. Dutton  was then switched to abiraterone/prednisone on 04/03/16. However repeat imaging studies in 06/12/16 along with a rising PSA suggested progression  of disease.   Pain on a scale of 0-10 is: Mid back- pelvic pain, not everyday.  Has prescription for pain medication-oxycodone.   If Spine Met(s), symptoms, if any, include:  Bowel/Bladder retention or incontinence (please describe):   Numbness or weakness in extremities (please describe):   Current Decadron regimen, if applicable:   Ambulatory status? Walker? Wheelchair?: No, uses a cane when outside the home.  BP (!) 145/61 (Patient Position: Sitting)   Pulse 64   Temp 97.9 F (36.6 C) (Oral)   Resp 16   Ht '5\' 11"'$  (1.803 m)   Wt 131 lb 6.4 oz (59.6 kg)   SpO2 100%   BMI 18.33 kg/m    Wt Readings from Last 3 Encounters:  05/08/18 131 lb 6.4 oz (59.6 kg)  05/01/16 141 lb (64 kg)  05/18/15 132 lb (59.9 kg)    SAFETY ISSUES:  Prior radiation? Dr. Valere Dross, feb-march  Pacemaker/ICD? No  Possible current pregnancy? No Is the patient on methotrexate? No  Current Complaints / other details:   -Radical prostatectomy 07/10/2006, Gleason 4+5 equals 9, IMRT completed 10/2006.  Intermittent androgen- deprivation therapy,  06/2007-present  Oncology History  A. S/P radical prostatectomy - 07/10/2006 B. pT3a, Gleason 4+5 equals 9. C. S/P IMRT completed 10/2006. D. Initiate initiation of intermittent androgen - deprivation therapy - 06/2007 - present. E. 05/16/2007 bone scan ab uptake T10 F. 06/28/09 CT CaP - no mets; sclerosis L4 G. Most recent initiation of ADT on 08/01/2010. H .Abnormal MRI scan with a history of question of L4 bone lesion I. 06/03/13 bone scan - abnormal uptake T10 and L4 J. 06/03/13 CT CAP - RUL 3 mm pulm nodule; left hepatic cystic lesions; sclerosis T10, L4, rt thyroid lesion K. 10/01/13 - PSA increased to 25.09 L. 10/15/13 started enzalutamide d/c'd 12/14/13 due to questionable seizure M. Responding to dose reduced enzalutamide 120 mg qd N. 09/06/15 bone scan - increased activity T10 lesion 0. 09/06/15 CT CAP - increased act T10 lesion, increased cystic  lesion left kidney P. 11/29/15 CT AP - stable sclerotic bone lesions; bladder wall thickening Q. 03/29/16 CT CAP - stable sclerotic bone mets R. 03/29/16 PSA to 23.8, discontinue enzalutamide, begin abiraterone/ prednisone (d/c'd) S. 06/12/16 bone scan - new uptake L4, rt hemisacrum T. 11/20/16 start docetaxel U. 02/12/2017 cycle 5 docetaxel, last dose given V. 03/05/2017 CT C/A/P and Bone Scan with progression of bone mets

## 2018-05-08 ENCOUNTER — Ambulatory Visit
Admission: RE | Admit: 2018-05-08 | Discharge: 2018-05-08 | Disposition: A | Discharge status: Home / Self Care | Payer: Medicare Other | Source: Ambulatory Visit | Attending: Radiation Oncology | Admitting: Radiation Oncology

## 2018-05-08 ENCOUNTER — Other Ambulatory Visit: Payer: Self-pay

## 2018-05-08 ENCOUNTER — Encounter: Payer: Self-pay | Admitting: Radiation Oncology

## 2018-05-08 DIAGNOSIS — I10 Essential (primary) hypertension: Secondary | ICD-10-CM | POA: Insufficient documentation

## 2018-05-08 DIAGNOSIS — Z87891 Personal history of nicotine dependence: Secondary | ICD-10-CM | POA: Diagnosis not present

## 2018-05-08 DIAGNOSIS — G893 Neoplasm related pain (acute) (chronic): Secondary | ICD-10-CM | POA: Diagnosis not present

## 2018-05-08 DIAGNOSIS — Z9289 Personal history of other medical treatment: Secondary | ICD-10-CM | POA: Diagnosis not present

## 2018-05-08 DIAGNOSIS — C61 Malignant neoplasm of prostate: Secondary | ICD-10-CM

## 2018-05-08 DIAGNOSIS — Z79899 Other long term (current) drug therapy: Secondary | ICD-10-CM | POA: Insufficient documentation

## 2018-05-08 DIAGNOSIS — Z85528 Personal history of other malignant neoplasm of kidney: Secondary | ICD-10-CM | POA: Diagnosis not present

## 2018-05-08 DIAGNOSIS — Z192 Hormone resistant malignancy status: Secondary | ICD-10-CM | POA: Diagnosis not present

## 2018-05-08 DIAGNOSIS — R9721 Rising PSA following treatment for malignant neoplasm of prostate: Secondary | ICD-10-CM | POA: Diagnosis not present

## 2018-05-08 DIAGNOSIS — Z510 Encounter for antineoplastic radiation therapy: Secondary | ICD-10-CM | POA: Insufficient documentation

## 2018-05-08 DIAGNOSIS — C7951 Secondary malignant neoplasm of bone: Secondary | ICD-10-CM

## 2018-05-08 DIAGNOSIS — Z9079 Acquired absence of other genital organ(s): Secondary | ICD-10-CM | POA: Diagnosis not present

## 2018-05-08 DIAGNOSIS — Z905 Acquired absence of kidney: Secondary | ICD-10-CM | POA: Diagnosis not present

## 2018-05-08 DIAGNOSIS — Z923 Personal history of irradiation: Secondary | ICD-10-CM | POA: Insufficient documentation

## 2018-05-08 NOTE — Progress Notes (Signed)
Radiation Oncology         [phone] ________________________________  Name: [name]        MRN: [id number]  Date of Service: 05/08/2018 DOB: 07-25-1933  HW:[id number], Jeffrey Quince, MD  Leanna Battles, MD     REFERRING PHYSICIAN: Leanna Battles, MD   DIAGNOSIS: The encounter diagnosis was Prostate cancer metastatic to bone [facility]).   HISTORY OF PRESENT ILLNESS: [name] is a 84 y.o. male seen at the request of Dr. Marcello Townsend at [facility] for a history of castrate resistant metastatic prostate cancer.  The patient was originally diagnosed in 2007 with his prostate cancer and underwent prostatectomy which revealed a pT3a tumor with a Gleason score 4+5.  He received IMRT to the prostatic fossa in February 2008, and his been on androgen deprivation following progressive disease.  This was initiated intermittently in 2011.  From what I can tell it appears that in 2014 he became castrate resistant and has seen multiple therapies including enzalutamide, and docetaxel.  He has been somewhat resistant to consider additional chemotherapy, and has been on low-dose dexamethasone.  His PSA in January 2019 was >158, and continue to elevate and jumped as high as 2200 in March 2019.  He has had stability of this however and his most recent was 2265.538 2016.  Despite the significance of his PSA, his systemic disease has been relatively stable until recently.  He has disease in the retroperitoneal nodes, as well as multiple bony metastases.  He has had documented disease in the thoracic spine for several years, and more recently developed difficulty with pain in the lumbar spine.  [facility]18 which revealed disease throughout the spine but specifically lesions at L3-L4.  He does not have any neurologic compromise clinically per Dr. Marcello Townsend or on imaging.  He has been pancytopenic with a hemoglobin of 7.6 and an platelet count of 61, he  did get a transfusion about a week ago.  He has been taking oxycodone for pain relief.  He comes today to discuss the options of palliative radiotherapy to the spine.  Of note he also did undergo right nephrectomy for renal cell carcinoma that was treated in October 2012.  It does not appear that this has recurred.   PREVIOUS RADIATION THERAPY: Yes   10/2006: The patient received IMRT to the prostatic fossa over 7 weeks with Dr. Valere Townsend   PAST MEDICAL HISTORY:  Past Medical History:  Diagnosis Date  . Cancer ([place])    ,prostate,kidney right  . Complication of anesthesia    raspy throat after anesth. - always been raspy since   . Gallstones   . GERD (gastroesophageal reflux disease)    occasional  . Headache(784.0)    migraines  . Heart murmur    in college  . Hypertension   . Incontinence of urine   . PONV (postoperative nausea and vomiting)        PAST SURGICAL HISTORY: Past Surgical History:  Procedure Laterality Date  . COLONOSCOPY WITH PROPOFOL  09/30/2012   Procedure: COLONOSCOPY WITH PROPOFOL;  Surgeon: Garlan Fair, MD;  Location: [facility];  Service: Endoscopy;  Laterality: N/A;  . FLEXIBLE SIGMOIDOSCOPY N/A 05/01/2016   Procedure: FLEXIBLE SIGMOIDOSCOPY;  Surgeon: Garlan Fair, MD;  Location: [facility];  Service: Endoscopy;  Laterality: N/A;  . HERNIA REPAIR     Left and Right   . IMPLANT FLETCHER SUIT / INTRACAVITARY RADIATION  APPLICATION    . Left leg vein procedure blood clot removed      . NEPHRECTOMY  2004   Right  . ORIF ANKLE FRACTURE Right 08/14/2013   Procedure: OPEN REDUCTION INTERNAL FIXATION (ORIF) RIGHT BIMALLEOLAR ANKLE FRACTURE;  Surgeon: Wylene Simmer, MD;  Location: [facility];  Service: Orthopedics;  Laterality: Right;  . Postatectomy  2007  . TONSILLECTOMY     age 58     FAMILY HISTORY: History reviewed. No pertinent family history.   SOCIAL HISTORY:  reports that he quit smoking about 59 years ago. He has never used smokeless tobacco.  He reports that he drinks alcohol. He reports that he does not use drugs.  The patient is married and resides in [place]. He is a retired Secondary school teacher, and is accompanied by his wife.   ALLERGIES: Ace inhibitors; Alcohol-sulfur [sulfur]; and Garlic   MEDICATIONS:  Current Outpatient Medications  Medication Sig Dispense Refill  . amLODipine (NORVASC) 5 MG tablet Take by mouth.    Marland Kitchen atenolol-chlorthalidone (TENORETIC) 50-25 MG per tablet Take 0.5 tablets by mouth every morning.    Marland Kitchen atorvastatin (LIPITOR) 40 MG tablet Take 40 mg by mouth at bedtime.    . calcipotriene (DOVONOX) 0.005 % cream Apply 1 application topically See admin instructions. He applies at bedtime as needed to scalp for psoriasis alternating with Ultravate Cream every 2 weeks.    . Calcium Carbonate-Vitamin D 600-400 MG-UNIT tablet Take by mouth.    . dexamethasone (DECADRON) 0.75 MG tablet Take 1 tablet in the morning    . Enzalutamide (XTANDI PO) Take by mouth.    . halobetasol (ULTRAVATE) 0.05 % cream Apply 1 application topically See admin instructions. He applies at bedtime as needed to scalp for psoriasis alternating with Dovonox Cream every 2 weeks.    Marland Kitchen leuprolide (LUPRON) 7.5 MG injection Inject 7.5 mg into the muscle as needed. He takes for his increased prostate specific antigen (PSA) levels.    Marland Kitchen losartan (COZAAR) 50 MG tablet Take 50 mg by mouth every morning.    . ondansetron (ZOFRAN) 4 MG tablet Take 4 mg by mouth every 8 (eight) hours as needed for nausea or vomiting.    Marland Kitchen oxyCODONE (OXY IR/ROXICODONE) 5 MG immediate release tablet Take one half tab every 4 hours for pain, if not improved take 1 tab every 4 hours.     No current facility-administered medications for this encounter.      REVIEW OF SYSTEMS: On review of systems, the patient reports that he is doing well overall. He describes low back pain particularly on the left and that this occasionally radiates down his left lateral thigh. He  has noticed pain at night more frequently than during the day and reports a few times this has woken him up at night. He denies any chest pain, shortness of breath, cough, fevers, chills, night sweats, unintended weight changes. He is incontinent since his prostatectomy but denies any bowel disturbances, and denies abdominal pain, nausea or vomiting. No other noted musculoskeletal or joint aches or pains are described. A complete review of systems is obtained and is otherwise negative.     PHYSICAL EXAM:  Wt Readings from Last 3 Encounters:  05/08/18 131 lb 6.4 oz (59.6 kg)  05/01/16 141 lb (64 kg)  05/18/15 132 lb (59.9 kg)   Temp Readings from Last 3 Encounters:  05/08/18 97.9 F (36.6 C) (Oral)  05/01/16 98 F (36.7 C) (Oral)  06/05/15 97.5 F (36.4 C) (Oral)  BP Readings from Last 3 Encounters:  05/08/18 (!) 145/61  05/01/16 (!) 151/72  09/15/15 139/75   Pulse Readings from Last 3 Encounters:  05/08/18 64  05/01/16 (!) 53  09/15/15 (!) 51   Pain Assessment Pain Score: 0-No pain In general this is a well appearing caucasian male in no acute distress. He is alert and oriented x4 and appropriate throughout the examination. HEENT reveals that the patient is normocephalic, atraumatic. EOMs are intact. Skin is intact without any evidence of gross lesions. Cardiopulmonary assessment is negative for acute distress and he exhibits normal effort. He notes pain to the left of the lumbar spine along L3-5 landmarks.   ECOG = 1  0 - Asymptomatic (Fully active, able to carry on all predisease activities without restriction)  1 - Symptomatic but completely ambulatory (Restricted in physically strenuous activity but ambulatory and able to carry out work of a light or sedentary nature. For example, light housework, office work)  2 - Symptomatic, <50% in bed during the day (Ambulatory and capable of all self care but unable to carry out any work activities. Up and about more than 50% of  waking hours)  3 - Symptomatic, >50% in bed, but not bedbound (Capable of only limited self-care, confined to bed or chair 50% or more of waking hours)  4 - Bedbound (Completely disabled. Cannot carry on any self-care. Totally confined to bed or chair)  5 - Death   Eustace Pen MM, Creech RH, Tormey DC, et al. [phone]). "Toxicity and response criteria of the [facility]". [place] Oncol. 5 (6): 649-55    LABORATORY DATA:  Lab Results  Component Value Date   WBC 6.4 08/14/2013   HGB 14.3 08/14/2013   HCT 40.6 08/14/2013   MCV 85.1 08/14/2013   PLT 137 (L) 08/14/2013   Lab Results  Component Value Date   NA 141 08/14/2013   K 4.0 08/14/2013   CL 102 08/14/2013   CO2 26 08/14/2013   Lab Results  Component Value Date   ALT 29 01/30/2013   AST 36 (H) 01/30/2013   ALKPHOS 68 01/30/2013   BILITOT 1.35 (H) 01/30/2013      RADIOGRAPHY: Mr Lumbar Spine Wo Contrast  Result Date: 05/04/2018 CLINICAL DATA:  84 year old male with lumbar back pain radiating to the left leg for about 2 weeks with no known injury. History of prostate cancer in 2007. EXAM: MRI LUMBAR SPINE WITHOUT CONTRAST TECHNIQUE: Multiplanar, multisequence MR imaging of the lumbar spine was performed. No intravenous contrast was administered. COMPARISON:  Whole-body bone scan 09/25/2007, and earlier. FINDINGS: Segmentation: Lumbar segmentation appears to be normal and will be designated as such for this report. Alignment:  Preserved lumbar vertebral height and alignment. Vertebrae: Diffusely abnormal marrow signal throughout the visible spine and pelvis in keeping with diffuse osseous metastatic disease. Vertebral bodies and posterior elements diffusely involved with no areas of sparing in the lumbar spine or visible sacrum. There is no substantial extraosseous extension of tumor identified, but there is occasional early ventral epidural extension of tumor (such as at the posterior L3 and L4 vertebral bodies  as seen on series 6, image 28). No visible neural foraminal encroachment by tumor. No pathologic fracture. Conus medullaris and cauda equina: Conus extends to the L1-L2 level. Conus and cauda equina appear normal. Paraspinal and other soft tissues: The right kidney appears to be surgically absent. Partially visible multiple left renal cysts, including a large at least 7.5 centimeter diameter but simple appearing  cyst at the L3-L4 spinal level. There is mild retroperitoneal/prevertebral lymphadenopathy. Abnormally enlarged lymph nodes anterior to the lumbar spine individually measure up to 20 millimeters short axis. There is mild nonspecific edema in the medial right psoas muscle at the L2-L3 level. The other visible paraspinal muscles are normal aside from a degree of atrophy. Disc levels: Mild for age spinal degeneration. No discrete disc herniation or spinal stenosis. IMPRESSION: 1. Positive for diffuse osseous metastatic disease throughout the visible spine and pelvis. No pathologic fracture or malignant neural impingement identified. Associated prevertebral lymphadenopathy compatible with metastatic retroperitoneal nodal involvement. 2. Solitary left kidney with polycystic disease. These results will be called to the ordering clinician or representative by the Radiologist Assistant, and communication documented in the PACS or zVision Dashboard. Electronically Signed   By: Genevie Ann M.D.   On: 05/04/2018 15:32       IMPRESSION/PLAN: 1. Castrate resistant metastatic prostate cancer with bone metastases involving the lumbar spine.  As above the patient has had documented disease and multiple bony sites previously but is most symptomatic in lumbar spine. Dr. Lisbeth Renshaw discusses the findings and work-up thus far as well as clinical features of his disease history.  Dr. Lisbeth Renshaw offers a palliative course of radiotherapy to the lumbar spine, and would focus on levels L3-5.  We discussed the risks, benefits, short, and  long term effects of radiotherapy, and the patient is interested in proceeding. Dr. Lisbeth Renshaw discusses the delivery and logistics of radiotherapy and anticipates a course of 2 weeks of radiotherapy. Written consent is obtained and placed in the chart, a copy was provided to the patient. The patient will simulate later today. We anticipate starting treatment on Monday of next week. 2. Pain secondary to #1. The patient has a prescription for oxycodone and we discussed use of this as needed, and a bowel regimen to avoid constipation.  In a visit lasting 45 minutes, greater than 50% of the time was spent face to face discussing his case, and coordinating the patient's care.   The above documentation reflects my direct findings during this shared patient visit. Please see the separate note by Dr. Lisbeth Renshaw on this date for the remainder of the patient's plan of care.    Carola Rhine, PAC

## 2018-05-09 DIAGNOSIS — C7951 Secondary malignant neoplasm of bone: Secondary | ICD-10-CM | POA: Diagnosis not present

## 2018-05-09 DIAGNOSIS — Z510 Encounter for antineoplastic radiation therapy: Secondary | ICD-10-CM | POA: Diagnosis not present

## 2018-05-12 ENCOUNTER — Ambulatory Visit
Admission: RE | Admit: 2018-05-12 | Discharge: 2018-05-12 | Disposition: A | Discharge status: Home / Self Care | Payer: Medicare Other | Source: Ambulatory Visit | Attending: Radiation Oncology | Admitting: Radiation Oncology

## 2018-05-12 DIAGNOSIS — C7951 Secondary malignant neoplasm of bone: Secondary | ICD-10-CM | POA: Diagnosis not present

## 2018-05-12 DIAGNOSIS — Z510 Encounter for antineoplastic radiation therapy: Secondary | ICD-10-CM | POA: Diagnosis not present

## 2018-05-13 ENCOUNTER — Ambulatory Visit
Admission: RE | Admit: 2018-05-13 | Discharge: 2018-05-13 | Disposition: A | Discharge status: Home / Self Care | Payer: Medicare Other | Source: Ambulatory Visit | Attending: Radiation Oncology | Admitting: Radiation Oncology

## 2018-05-13 DIAGNOSIS — C7951 Secondary malignant neoplasm of bone: Secondary | ICD-10-CM | POA: Diagnosis not present

## 2018-05-13 DIAGNOSIS — Z510 Encounter for antineoplastic radiation therapy: Secondary | ICD-10-CM | POA: Diagnosis not present

## 2018-05-14 ENCOUNTER — Ambulatory Visit
Admission: RE | Admit: 2018-05-14 | Discharge: 2018-05-14 | Disposition: A | Discharge status: Home / Self Care | Payer: Medicare Other | Source: Ambulatory Visit | Attending: Radiation Oncology | Admitting: Radiation Oncology

## 2018-05-14 DIAGNOSIS — Z510 Encounter for antineoplastic radiation therapy: Secondary | ICD-10-CM | POA: Diagnosis not present

## 2018-05-14 DIAGNOSIS — C7951 Secondary malignant neoplasm of bone: Secondary | ICD-10-CM | POA: Diagnosis not present

## 2018-05-15 ENCOUNTER — Ambulatory Visit
Admission: RE | Admit: 2018-05-15 | Discharge: 2018-05-15 | Disposition: A | Discharge status: Home / Self Care | Payer: Medicare Other | Source: Ambulatory Visit | Attending: Radiation Oncology | Admitting: Radiation Oncology

## 2018-05-15 DIAGNOSIS — Z510 Encounter for antineoplastic radiation therapy: Secondary | ICD-10-CM | POA: Diagnosis not present

## 2018-05-15 DIAGNOSIS — C7951 Secondary malignant neoplasm of bone: Secondary | ICD-10-CM | POA: Diagnosis not present

## 2018-05-16 ENCOUNTER — Ambulatory Visit
Admission: RE | Admit: 2018-05-16 | Discharge: 2018-05-16 | Disposition: A | Discharge status: Home / Self Care | Payer: Medicare Other | Source: Ambulatory Visit | Attending: Radiation Oncology | Admitting: Radiation Oncology

## 2018-05-16 DIAGNOSIS — Z510 Encounter for antineoplastic radiation therapy: Secondary | ICD-10-CM | POA: Diagnosis not present

## 2018-05-16 DIAGNOSIS — C7951 Secondary malignant neoplasm of bone: Secondary | ICD-10-CM | POA: Diagnosis not present

## 2018-05-20 ENCOUNTER — Ambulatory Visit
Admission: RE | Admit: 2018-05-20 | Discharge: 2018-05-20 | Disposition: A | Discharge status: Home / Self Care | Payer: Medicare Other | Source: Ambulatory Visit | Attending: Radiation Oncology | Admitting: Radiation Oncology

## 2018-05-20 DIAGNOSIS — C7951 Secondary malignant neoplasm of bone: Secondary | ICD-10-CM | POA: Diagnosis not present

## 2018-05-20 DIAGNOSIS — Z510 Encounter for antineoplastic radiation therapy: Secondary | ICD-10-CM | POA: Diagnosis not present

## 2018-05-20 DIAGNOSIS — C61 Malignant neoplasm of prostate: Secondary | ICD-10-CM | POA: Insufficient documentation

## 2018-05-21 ENCOUNTER — Ambulatory Visit
Admission: RE | Admit: 2018-05-21 | Discharge: 2018-05-21 | Disposition: A | Discharge status: Home / Self Care | Payer: Medicare Other | Source: Ambulatory Visit | Attending: Radiation Oncology | Admitting: Radiation Oncology

## 2018-05-21 DIAGNOSIS — C61 Malignant neoplasm of prostate: Secondary | ICD-10-CM | POA: Diagnosis not present

## 2018-05-21 DIAGNOSIS — Z510 Encounter for antineoplastic radiation therapy: Secondary | ICD-10-CM | POA: Diagnosis not present

## 2018-05-21 DIAGNOSIS — C7951 Secondary malignant neoplasm of bone: Secondary | ICD-10-CM | POA: Diagnosis not present

## 2018-05-22 ENCOUNTER — Ambulatory Visit
Admission: RE | Admit: 2018-05-22 | Discharge: 2018-05-22 | Disposition: A | Discharge status: Home / Self Care | Payer: Medicare Other | Source: Ambulatory Visit | Attending: Radiation Oncology | Admitting: Radiation Oncology

## 2018-05-22 DIAGNOSIS — C7951 Secondary malignant neoplasm of bone: Secondary | ICD-10-CM | POA: Diagnosis not present

## 2018-05-22 DIAGNOSIS — C61 Malignant neoplasm of prostate: Secondary | ICD-10-CM | POA: Diagnosis not present

## 2018-05-22 DIAGNOSIS — Z510 Encounter for antineoplastic radiation therapy: Secondary | ICD-10-CM | POA: Diagnosis not present

## 2018-05-23 ENCOUNTER — Ambulatory Visit
Admission: RE | Admit: 2018-05-23 | Discharge: 2018-05-23 | Disposition: A | Discharge status: Home / Self Care | Payer: Medicare Other | Source: Ambulatory Visit | Attending: Radiation Oncology | Admitting: Radiation Oncology

## 2018-05-23 DIAGNOSIS — Z510 Encounter for antineoplastic radiation therapy: Secondary | ICD-10-CM | POA: Diagnosis not present

## 2018-05-23 DIAGNOSIS — C7951 Secondary malignant neoplasm of bone: Secondary | ICD-10-CM | POA: Diagnosis not present

## 2018-05-23 DIAGNOSIS — C61 Malignant neoplasm of prostate: Secondary | ICD-10-CM | POA: Diagnosis not present

## 2018-05-26 ENCOUNTER — Other Ambulatory Visit: Payer: Self-pay | Admitting: Radiation Oncology

## 2018-05-26 ENCOUNTER — Inpatient Hospital Stay: Payer: Medicare Other | Attending: Radiation Oncology

## 2018-05-26 ENCOUNTER — Telehealth: Payer: Self-pay | Admitting: Medical Oncology

## 2018-05-26 ENCOUNTER — Ambulatory Visit
Admission: RE | Admit: 2018-05-26 | Discharge: 2018-05-26 | Disposition: A | Discharge status: Home / Self Care | Payer: Medicare Other | Source: Ambulatory Visit | Attending: Radiation Oncology | Admitting: Radiation Oncology

## 2018-05-26 ENCOUNTER — Encounter: Payer: Self-pay | Admitting: Radiation Oncology

## 2018-05-26 VITALS — BP 117/50 | HR 70 | Resp 16

## 2018-05-26 DIAGNOSIS — Z681 Body mass index (BMI) 19 or less, adult: Secondary | ICD-10-CM | POA: Diagnosis not present

## 2018-05-26 DIAGNOSIS — E860 Dehydration: Secondary | ICD-10-CM | POA: Diagnosis not present

## 2018-05-26 DIAGNOSIS — E46 Unspecified protein-calorie malnutrition: Secondary | ICD-10-CM | POA: Diagnosis not present

## 2018-05-26 DIAGNOSIS — D6489 Other specified anemias: Secondary | ICD-10-CM | POA: Diagnosis not present

## 2018-05-26 DIAGNOSIS — Z5189 Encounter for other specified aftercare: Secondary | ICD-10-CM | POA: Insufficient documentation

## 2018-05-26 DIAGNOSIS — Z510 Encounter for antineoplastic radiation therapy: Secondary | ICD-10-CM | POA: Diagnosis not present

## 2018-05-26 DIAGNOSIS — C61 Malignant neoplasm of prostate: Secondary | ICD-10-CM | POA: Diagnosis not present

## 2018-05-26 DIAGNOSIS — I10 Essential (primary) hypertension: Secondary | ICD-10-CM | POA: Diagnosis not present

## 2018-05-26 DIAGNOSIS — N183 Chronic kidney disease, stage 3 (moderate): Secondary | ICD-10-CM | POA: Diagnosis not present

## 2018-05-26 DIAGNOSIS — C7951 Secondary malignant neoplasm of bone: Secondary | ICD-10-CM | POA: Diagnosis not present

## 2018-05-26 DIAGNOSIS — C649 Malignant neoplasm of unspecified kidney, except renal pelvis: Secondary | ICD-10-CM | POA: Diagnosis not present

## 2018-05-26 DIAGNOSIS — R5383 Other fatigue: Secondary | ICD-10-CM | POA: Diagnosis not present

## 2018-05-26 MED ORDER — SODIUM CHLORIDE 0.9 % IV SOLN
INTRAVENOUS | Status: DC
  Administered 2018-05-26: 16:00:00 via INTRAVENOUS
  Filled 2018-05-26: qty 250

## 2018-05-26 MED ORDER — SODIUM CHLORIDE 0.9 % IV SOLN
Freq: Once | INTRAVENOUS | Status: DC
  Filled 2018-05-26: qty 250

## 2018-05-26 NOTE — Telephone Encounter (Signed)
Pt coming today for IVF instead of tomorrow.

## 2018-05-26 NOTE — Patient Instructions (Signed)
Dehydration, Adult Dehydration is when there is not enough fluid or water in your body. This happens when you lose more fluids than you take in. Dehydration can range from mild to very bad. It should be treated right away to keep it from getting very bad. Symptoms of mild dehydration may include:  Thirst.  Dry lips.  Slightly dry mouth.  Dry, warm skin.  Dizziness. Symptoms of moderate dehydration may include:  Very dry mouth.  Muscle cramps.  Dark pee (urine). Pee may be the color of tea.  Your body making less pee.  Your eyes making fewer tears.  Heartbeat that is uneven or faster than normal (palpitations).  Headache.  Light-headedness, especially when you stand up from sitting.  Fainting (syncope). Symptoms of very bad dehydration may include:  Changes in skin, such as: ? Cold and clammy skin. ? Blotchy (mottled) or pale skin. ? Skin that does not quickly return to normal after being lightly pinched and let go (poor skin turgor).  Changes in body fluids, such as: ? Feeling very thirsty. ? Your eyes making fewer tears. ? Not sweating when body temperature is high, such as in hot weather. ? Your body making very little pee.  Changes in vital signs, such as: ? Weak pulse. ? Pulse that is more than 100 beats a minute when you are sitting still. ? Fast breathing. ? Low blood pressure.  Other changes, such as: ? Sunken eyes. ? Cold hands and feet. ? Confusion. ? Lack of energy (lethargy). ? Trouble waking up from sleep. ? Short-term weight loss. ? Unconsciousness. Follow these instructions at home:  If told by your doctor, drink an ORS: ? Make an ORS by using instructions on the package. ? Start by drinking small amounts, about  cup (120 mL) every 5-10 minutes. ? Slowly drink more until you have had the amount that your doctor said to have.  Drink enough clear fluid to keep your pee clear or pale yellow. If you were told to drink an ORS, finish the ORS  first, then start slowly drinking clear fluids. Drink fluids such as: ? Water. Do not drink only water by itself. Doing that can make the salt (sodium) level in your body get too low (hyponatremia). ? Ice chips. ? Fruit juice that you have added water to (diluted). ? Low-calorie sports drinks.  Avoid: ? Alcohol. ? Drinks that have a lot of sugar. These include high-calorie sports drinks, fruit juice that does not have water added, and soda. ? Caffeine. ? Foods that are greasy or have a lot of fat or sugar.  Take over-the-counter and prescription medicines only as told by your doctor.  Do not take salt tablets. Doing that can make the salt level in your body get too high (hypernatremia).  Eat foods that have minerals (electrolytes). Examples include bananas, oranges, potatoes, tomatoes, and spinach.  Keep all follow-up visits as told by your doctor. This is important. Contact a doctor if:  You have belly (abdominal) pain that: ? Gets worse. ? Stays in one area (localizes).  You have a rash.  You have a stiff neck.  You get angry or annoyed more easily than normal (irritability).  You are more sleepy than normal.  You have a harder time waking up than normal.  You feel: ? Weak. ? Dizzy. ? Very thirsty.  You have peed (urinated) only a small amount of very dark pee during 6-8 hours. Get help right away if:  You have symptoms of   very bad dehydration.  You cannot drink fluids without throwing up (vomiting).  Your symptoms get worse with treatment.  You have a fever.  You have a very bad headache.  You are throwing up or having watery poop (diarrhea) and it: ? Gets worse. ? Does not go away.  You have blood or something green (bile) in your throw-up.  You have blood in your poop (stool). This may cause poop to look black and tarry.  You have not peed in 6-8 hours.  You pass out (faint).  Your heart rate when you are sitting still is more than 100 beats a  minute.  You have trouble breathing. This information is not intended to replace advice given to you by your health care provider. Make sure you discuss any questions you have with your health care provider. Document Released: 06/30/2009 Document Revised: 03/23/2016 Document Reviewed: 10/28/2015 Elsevier Interactive Patient Education  2018 [employer]  

## 2018-05-26 NOTE — Progress Notes (Signed)
  Radiation Oncology         [phone] ________________________________  Name: GRANTHAM HIPPERT MRN: [id number]  Date: 05/08/2018  DOB: 02-19-1933  SIMULATION AND TREATMENT PLANNING NOTE  DIAGNOSIS:     ICD-10-CM   1. Bone metastasis ([place]) C79.51      Site:  L-spine  NARRATIVE:  The patient was brought to the [facility].  Identity was confirmed.  All relevant records and images related to the planned course of therapy were reviewed.   Written consent to proceed with treatment was confirmed which was freely given after reviewing the details related to the planned course of therapy had been reviewed with the patient.  Then, the patient was set-up in a stable reproducible  supine position for radiation therapy.  CT images were obtained.  Surface markings were placed.    Medically necessary complex treatment device(s) for immobilization:  Vac-lock bag.   The CT images were loaded into the planning software.  Then the target and avoidance structures were contoured.  Treatment planning then occurred.  The radiation prescription was entered and confirmed.  A total of 2 complex treatment devices were fabricated which relate to the designed radiation treatment fields. Each of these customized fields/ complex treatment devices will be used on a daily basis during the radiation course. I have requested : Isodose Plan.   PLAN:  The patient will receive 30 Gy in 10 fractions.  ________________________________   Jodelle Gross, MD, PhD

## 2018-05-26 NOTE — Progress Notes (Signed)
I spoke with Colletta Maryland, NP at facility where pt resides and she is in need of helping the patient receive IVF orders as she does not have courtesy privledges for our system. The patient has had a clinical decline in urinary output, fatigue, and has had an upward trend in his creatinine and BUN. We will coordinate through infusion room for 1L NS but defer to his primary team at [facility] if he needs additional hydration.

## 2018-05-27 ENCOUNTER — Ambulatory Visit: Payer: Medicare Other

## 2018-05-28 DIAGNOSIS — N183 Chronic kidney disease, stage 3 (moderate): Secondary | ICD-10-CM | POA: Diagnosis not present

## 2018-05-28 DIAGNOSIS — D6489 Other specified anemias: Secondary | ICD-10-CM | POA: Diagnosis not present

## 2018-05-31 DIAGNOSIS — C61 Malignant neoplasm of prostate: Secondary | ICD-10-CM | POA: Diagnosis not present

## 2018-05-31 DIAGNOSIS — I10 Essential (primary) hypertension: Secondary | ICD-10-CM | POA: Diagnosis not present

## 2018-05-31 DIAGNOSIS — Z85528 Personal history of other malignant neoplasm of kidney: Secondary | ICD-10-CM | POA: Diagnosis not present

## 2018-05-31 DIAGNOSIS — C7951 Secondary malignant neoplasm of bone: Secondary | ICD-10-CM | POA: Diagnosis not present

## 2018-05-31 DIAGNOSIS — E785 Hyperlipidemia, unspecified: Secondary | ICD-10-CM | POA: Diagnosis not present

## 2018-05-31 DIAGNOSIS — E46 Unspecified protein-calorie malnutrition: Secondary | ICD-10-CM | POA: Diagnosis not present

## 2018-05-31 DIAGNOSIS — D630 Anemia in neoplastic disease: Secondary | ICD-10-CM | POA: Diagnosis not present

## 2018-05-31 DIAGNOSIS — N184 Chronic kidney disease, stage 4 (severe): Secondary | ICD-10-CM | POA: Diagnosis not present

## 2018-05-31 DIAGNOSIS — C7940 Secondary malignant neoplasm of unspecified part of nervous system: Secondary | ICD-10-CM | POA: Diagnosis not present

## 2018-06-02 DIAGNOSIS — C61 Malignant neoplasm of prostate: Secondary | ICD-10-CM | POA: Diagnosis not present

## 2018-06-02 DIAGNOSIS — Z85528 Personal history of other malignant neoplasm of kidney: Secondary | ICD-10-CM | POA: Diagnosis not present

## 2018-06-02 DIAGNOSIS — C7940 Secondary malignant neoplasm of unspecified part of nervous system: Secondary | ICD-10-CM | POA: Diagnosis not present

## 2018-06-02 DIAGNOSIS — N184 Chronic kidney disease, stage 4 (severe): Secondary | ICD-10-CM | POA: Diagnosis not present

## 2018-06-02 DIAGNOSIS — C7951 Secondary malignant neoplasm of bone: Secondary | ICD-10-CM | POA: Diagnosis not present

## 2018-06-02 DIAGNOSIS — D630 Anemia in neoplastic disease: Secondary | ICD-10-CM | POA: Diagnosis not present

## 2018-06-03 DIAGNOSIS — C7940 Secondary malignant neoplasm of unspecified part of nervous system: Secondary | ICD-10-CM | POA: Diagnosis not present

## 2018-06-03 DIAGNOSIS — N184 Chronic kidney disease, stage 4 (severe): Secondary | ICD-10-CM | POA: Diagnosis not present

## 2018-06-03 DIAGNOSIS — D630 Anemia in neoplastic disease: Secondary | ICD-10-CM | POA: Diagnosis not present

## 2018-06-03 DIAGNOSIS — C61 Malignant neoplasm of prostate: Secondary | ICD-10-CM | POA: Diagnosis not present

## 2018-06-03 DIAGNOSIS — Z85528 Personal history of other malignant neoplasm of kidney: Secondary | ICD-10-CM | POA: Diagnosis not present

## 2018-06-03 DIAGNOSIS — C7951 Secondary malignant neoplasm of bone: Secondary | ICD-10-CM | POA: Diagnosis not present

## 2018-06-03 NOTE — Progress Notes (Signed)
  Radiation Oncology         [phone] ________________________________  Name: [name] MRN: [id number]  Date: 05/26/2018  DOB: 1932-12-25  End of Treatment Note  Diagnosis:   84 y.o. 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  He did experience some fatigue and developed nausea, vomiting, and diarrhea over the course of treatment but otherwise denied any significant difficulties related to radiation treatment.  Plan: The patient has completed radiation treatment. The patient will return to radiation oncology clinic for routine followup in one month. I advised them to call or return sooner if they have any questions or concerns related to their recovery or treatment.  ------------------------------------------------  Jodelle Gross, MD, PhD  This document serves as a record of services personally performed by Kyung Rudd, MD. It was created on his behalf by Rae Lips, a trained medical scribe. The creation of this record is based on the scribe's personal observations and the provider's statements to them. This document has been checked and approved by the attending provider.

## 2018-06-04 DIAGNOSIS — C7940 Secondary malignant neoplasm of unspecified part of nervous system: Secondary | ICD-10-CM | POA: Diagnosis not present

## 2018-06-04 DIAGNOSIS — Z85528 Personal history of other malignant neoplasm of kidney: Secondary | ICD-10-CM | POA: Diagnosis not present

## 2018-06-04 DIAGNOSIS — C61 Malignant neoplasm of prostate: Secondary | ICD-10-CM | POA: Diagnosis not present

## 2018-06-04 DIAGNOSIS — D630 Anemia in neoplastic disease: Secondary | ICD-10-CM | POA: Diagnosis not present

## 2018-06-04 DIAGNOSIS — N184 Chronic kidney disease, stage 4 (severe): Secondary | ICD-10-CM | POA: Diagnosis not present

## 2018-06-04 DIAGNOSIS — C7951 Secondary malignant neoplasm of bone: Secondary | ICD-10-CM | POA: Diagnosis not present

## 2018-06-05 DIAGNOSIS — C7951 Secondary malignant neoplasm of bone: Secondary | ICD-10-CM | POA: Diagnosis not present

## 2018-06-05 DIAGNOSIS — C61 Malignant neoplasm of prostate: Secondary | ICD-10-CM | POA: Diagnosis not present

## 2018-06-05 DIAGNOSIS — N184 Chronic kidney disease, stage 4 (severe): Secondary | ICD-10-CM | POA: Diagnosis not present

## 2018-06-05 DIAGNOSIS — D630 Anemia in neoplastic disease: Secondary | ICD-10-CM | POA: Diagnosis not present

## 2018-06-05 DIAGNOSIS — Z85528 Personal history of other malignant neoplasm of kidney: Secondary | ICD-10-CM | POA: Diagnosis not present

## 2018-06-05 DIAGNOSIS — C7940 Secondary malignant neoplasm of unspecified part of nervous system: Secondary | ICD-10-CM | POA: Diagnosis not present

## 2018-06-06 DIAGNOSIS — D630 Anemia in neoplastic disease: Secondary | ICD-10-CM | POA: Diagnosis not present

## 2018-06-06 DIAGNOSIS — Z85528 Personal history of other malignant neoplasm of kidney: Secondary | ICD-10-CM | POA: Diagnosis not present

## 2018-06-06 DIAGNOSIS — C7951 Secondary malignant neoplasm of bone: Secondary | ICD-10-CM | POA: Diagnosis not present

## 2018-06-06 DIAGNOSIS — C7940 Secondary malignant neoplasm of unspecified part of nervous system: Secondary | ICD-10-CM | POA: Diagnosis not present

## 2018-06-06 DIAGNOSIS — N184 Chronic kidney disease, stage 4 (severe): Secondary | ICD-10-CM | POA: Diagnosis not present

## 2018-06-06 DIAGNOSIS — C61 Malignant neoplasm of prostate: Secondary | ICD-10-CM | POA: Diagnosis not present

## 2018-06-17 DEATH — deceased

## 2018-06-29 ENCOUNTER — Telehealth: Payer: Self-pay | Admitting: Radiation Oncology

## 2018-06-30 ENCOUNTER — Ambulatory Visit: Admitting: Radiation Oncology

## 2018-07-01 NOTE — Telephone Encounter (Signed)
LM for pt to reschedule. After the fact we found out he has passed away.

## 2019-11-24 IMAGING — MR MR LUMBAR SPINE W/O CM
5 series · 48 of 48 positions shown · non-contrast
Comparison: Whole-body bone scan 09/25/2007, and earlier.

CLINICAL DATA: 85-year-old male with lumbar back pain radiating to
the left leg for about 2 weeks with no known injury. History of
prostate cancer in 5220.

EXAM:
MRI LUMBAR SPINE WITHOUT CONTRAST
TECHNIQUE: Multiplanar, multisequence MR imaging of the lumbar spine was
performed. No intravenous contrast was administered.

[Series 2: T1 · sagittal · 4.0mm · 1.02mm/px · 6 of 15 slices shown (1 of 2)]
[im 1/15]
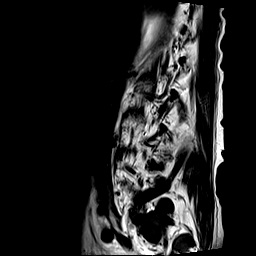
[im 3/15]
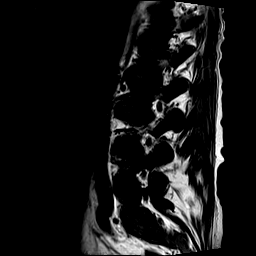
[im 6/15]
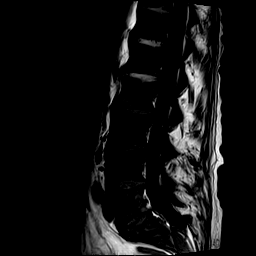
[im 9/15]
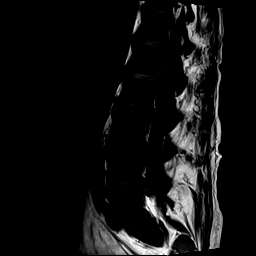
[im 12/15]
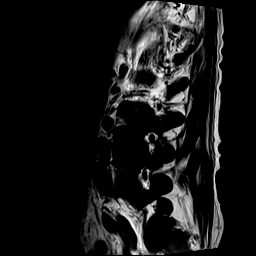
[im 15/15]
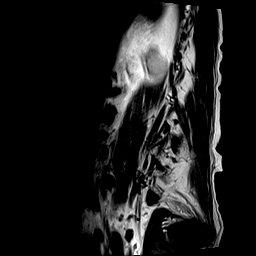

[Series 3: T2 · sagittal · 4.0mm · 1.02mm/px · 5 of 15 slices shown (1 of 2)]
[im 1/15]
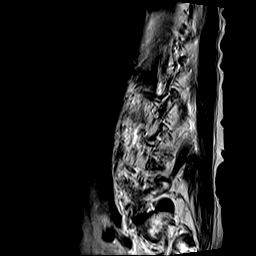
[im 4/15]
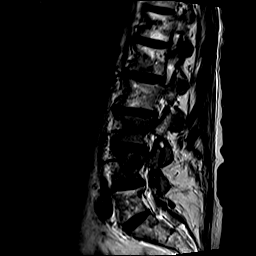
[im 8/15]
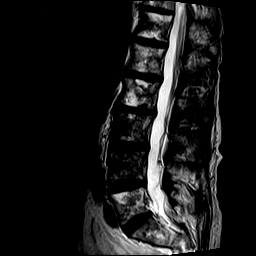
[im 11/15]
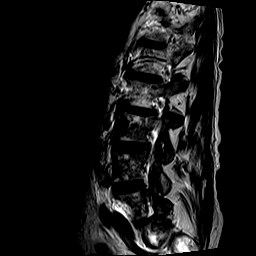
[im 15/15]
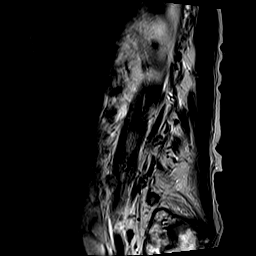

[Series 4: STIR · sagittal · 4.0mm · 1.02mm/px · 5 of 15 slices shown]
[im 1/15]
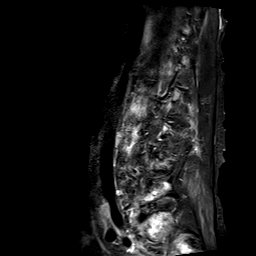
[im 4/15]
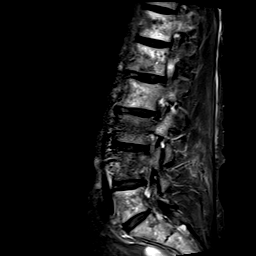
[im 8/15]
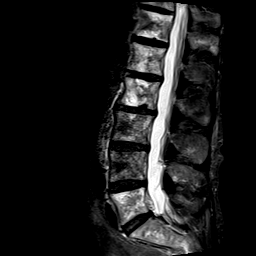
[im 11/15]
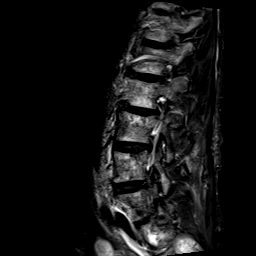
[im 15/15]
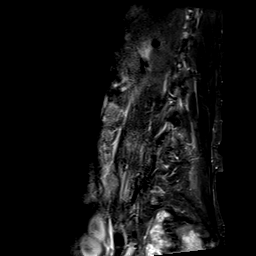

[Series 5: T2 · axial · 4.0mm · 0.78mm/px · z∈[-87,+147]mm · 16 of 43 slices shown (2 of 2)]
[im 1/43]
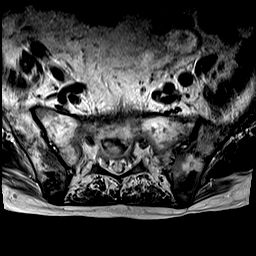
[im 3/43]
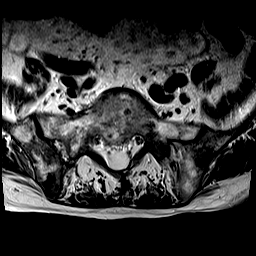
[im 6/43]
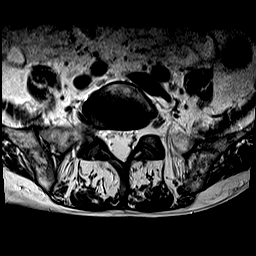
[im 9/43]
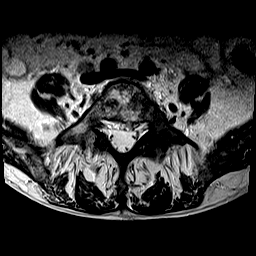
[im 12/43]
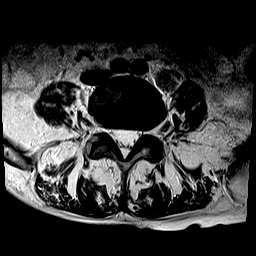
[im 15/43]
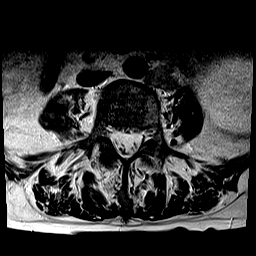
[im 17/43]
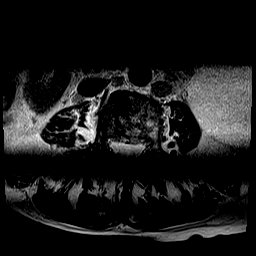
[im 20/43]
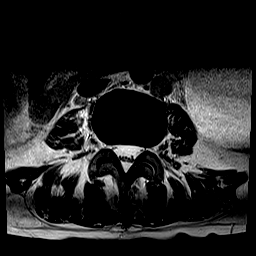
[im 23/43]
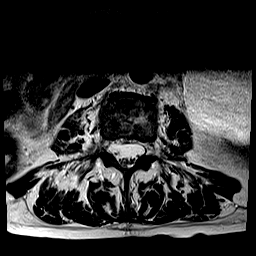
[im 26/43]
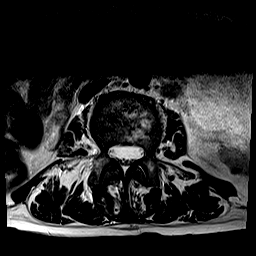
[im 29/43]
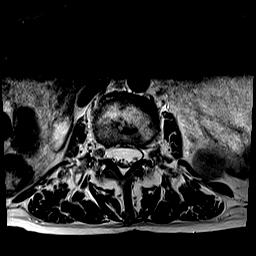
[im 31/43]
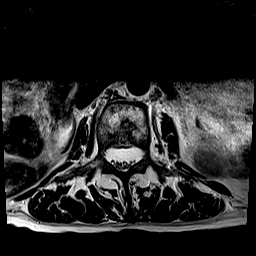
[im 34/43]
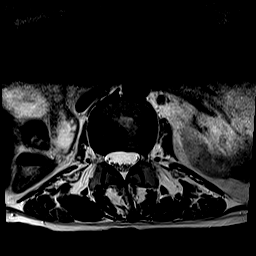
[im 37/43]
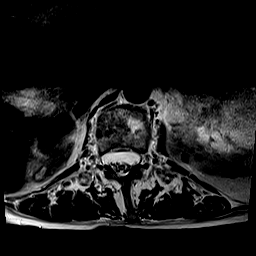
[im 40/43]
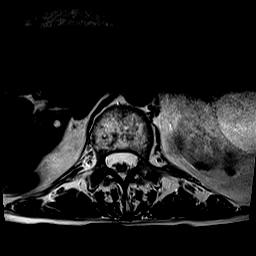
[im 43/43]
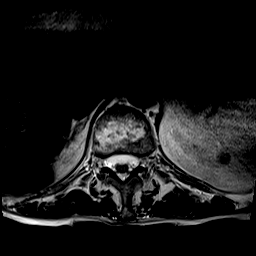

[Series 6: T1 · axial · 4.0mm · 0.78mm/px · z∈[-87,+147]mm · 16 of 43 slices shown (2 of 2)]
[im 1/43]
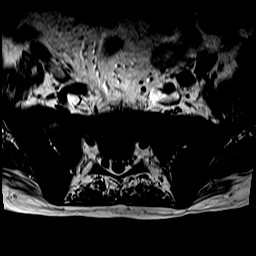
[im 3/43]
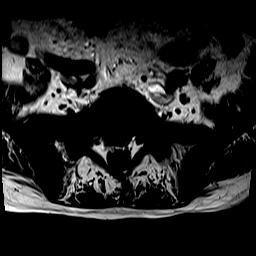
[im 6/43]
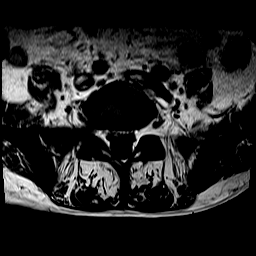
[im 9/43]
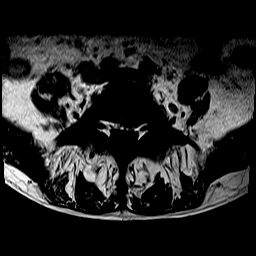
[im 12/43]
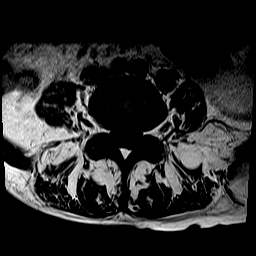
[im 15/43]
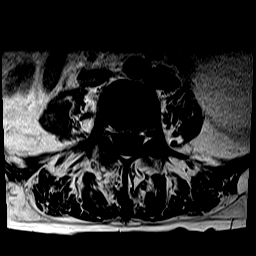
[im 17/43]
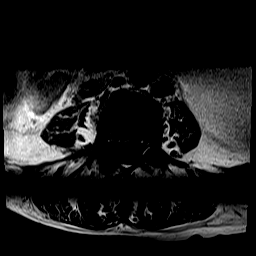
[im 20/43]
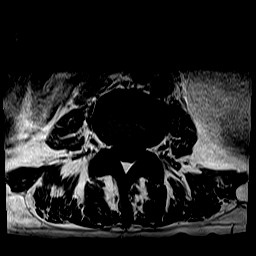
[im 23/43]
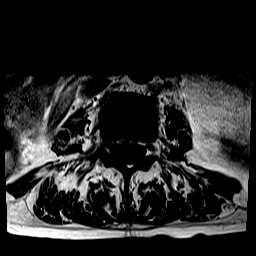
[im 26/43]
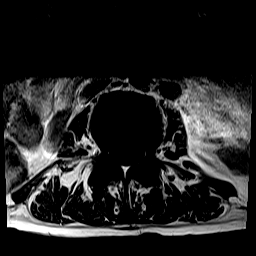
[im 29/43]
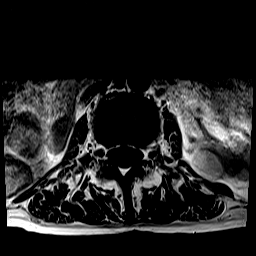
[im 31/43]
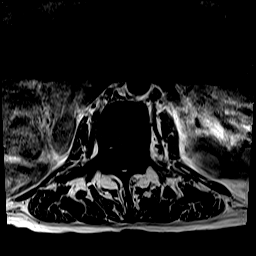
[im 34/43]
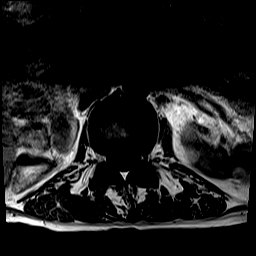
[im 37/43]
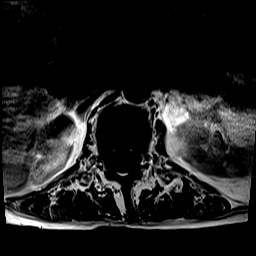
[im 40/43]
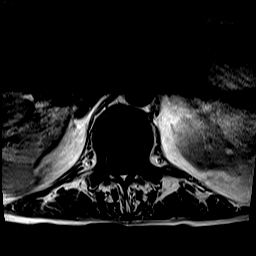
[im 43/43]
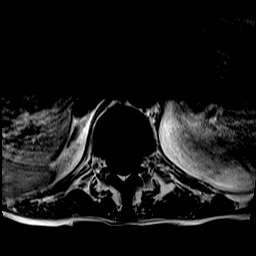

[48 of 48 positions shown; findings below may reference images not displayed]

FINDINGS: Segmentation: Lumbar segmentation appears to be normal and will be
designated as such for this report.

Alignment:  Preserved lumbar vertebral height and alignment.

Vertebrae: Diffusely abnormal marrow signal throughout the visible
spine and pelvis in keeping with diffuse osseous metastatic disease.
Vertebral bodies and posterior elements diffusely involved with no
areas of sparing in the lumbar spine or visible sacrum. There is no
substantial extraosseous extension of tumor identified, but there is
occasional early ventral epidural extension of tumor (such as at the
posterior L3 and L4 vertebral bodies as seen on series 6, image 28).
No visible neural foraminal encroachment by tumor. No pathologic
fracture.

Conus medullaris and cauda equina: Conus extends to the L1-L2 level.
Conus and cauda equina appear normal.

Paraspinal and other soft tissues: The right kidney appears to be
surgically absent. Partially visible multiple left renal cysts,
including a large at least 7.5 centimeter diameter but simple
appearing cyst at the L3-L4 spinal level.

There is mild retroperitoneal/prevertebral lymphadenopathy.
Abnormally enlarged lymph nodes anterior to the lumbar spine
individually measure up to 20 millimeters short axis.

There is mild nonspecific edema in the medial right psoas muscle at
the L2-L3 level. The other visible paraspinal muscles are normal
aside from a degree of atrophy.

Disc levels:

Mild for age spinal degeneration. No discrete disc herniation or
spinal stenosis.
IMPRESSION: 1. Positive for diffuse osseous metastatic disease throughout the
visible spine and pelvis.
No pathologic fracture or malignant neural impingement identified.
Associated prevertebral lymphadenopathy compatible with metastatic
retroperitoneal nodal involvement.
2. Solitary left kidney with polycystic disease.

These results will be called to the ordering clinician or
representative by the Radiologist Assistant, and communication
documented in the PACS or zVision Dashboard.
